# Patient Record
Sex: Male | Born: 1935 | Race: White | Hispanic: No | Smoking: Never smoker
Health system: Southern US, Community
[De-identification: ages and names within clinical notes are randomized; demographics above are authoritative.]

## PROBLEM LIST (undated history)

## (undated) DIAGNOSIS — C61 Malignant neoplasm of prostate: Secondary | ICD-10-CM

## (undated) DIAGNOSIS — I10 Essential (primary) hypertension: Secondary | ICD-10-CM

## (undated) DIAGNOSIS — I472 Ventricular tachycardia: Secondary | ICD-10-CM

## (undated) DIAGNOSIS — R0602 Shortness of breath: Secondary | ICD-10-CM

## (undated) DIAGNOSIS — Z8673 Personal history of transient ischemic attack (TIA), and cerebral infarction without residual deficits: Secondary | ICD-10-CM

## (undated) DIAGNOSIS — R569 Unspecified convulsions: Secondary | ICD-10-CM

## (undated) DIAGNOSIS — R778 Other specified abnormalities of plasma proteins: Secondary | ICD-10-CM

## (undated) DIAGNOSIS — Z8719 Personal history of other diseases of the digestive system: Secondary | ICD-10-CM

## (undated) DIAGNOSIS — I5032 Chronic diastolic (congestive) heart failure: Secondary | ICD-10-CM

## (undated) DIAGNOSIS — R7989 Other specified abnormal findings of blood chemistry: Secondary | ICD-10-CM

## (undated) DIAGNOSIS — I4891 Unspecified atrial fibrillation: Secondary | ICD-10-CM

## (undated) DIAGNOSIS — N183 Chronic kidney disease, stage 3 unspecified: Secondary | ICD-10-CM

## (undated) DIAGNOSIS — C7951 Secondary malignant neoplasm of bone: Secondary | ICD-10-CM

## (undated) DIAGNOSIS — D696 Thrombocytopenia, unspecified: Secondary | ICD-10-CM

## (undated) DIAGNOSIS — H409 Unspecified glaucoma: Secondary | ICD-10-CM

## (undated) DIAGNOSIS — E785 Hyperlipidemia, unspecified: Secondary | ICD-10-CM

## (undated) HISTORY — DX: Malignant neoplasm of prostate: C61

## (undated) HISTORY — DX: Unspecified atrial fibrillation: I48.91

## (undated) HISTORY — DX: Ventricular tachycardia: I47.2

## (undated) HISTORY — DX: Hyperlipidemia, unspecified: E78.5

## (undated) HISTORY — DX: Chronic diastolic (congestive) heart failure: I50.32

## (undated) HISTORY — PX: WRIST SURGERY: SHX841

## (undated) HISTORY — DX: Other specified abnormal findings of blood chemistry: R79.89

## (undated) HISTORY — PX: CATARACT EXTRACTION: SUR2

## (undated) HISTORY — DX: Other specified abnormalities of plasma proteins: R77.8

## (undated) HISTORY — DX: Essential (primary) hypertension: I10

---

## 1991-11-20 DIAGNOSIS — C61 Malignant neoplasm of prostate: Secondary | ICD-10-CM

## 1991-11-20 HISTORY — DX: Malignant neoplasm of prostate: C61

## 2001-07-18 ENCOUNTER — Encounter: Payer: Self-pay | Admitting: Orthopedic Surgery

## 2001-07-18 ENCOUNTER — Encounter: Admission: RE | Admit: 2001-07-18 | Discharge: 2001-07-18 | Payer: Self-pay | Admitting: Orthopedic Surgery

## 2001-07-22 ENCOUNTER — Ambulatory Visit (HOSPITAL_BASED_OUTPATIENT_CLINIC_OR_DEPARTMENT_OTHER): Admission: RE | Admit: 2001-07-22 | Discharge: 2001-07-22 | Payer: Self-pay | Admitting: Orthopedic Surgery

## 2002-11-19 DIAGNOSIS — I4729 Other ventricular tachycardia: Secondary | ICD-10-CM

## 2002-11-19 DIAGNOSIS — I472 Ventricular tachycardia: Secondary | ICD-10-CM

## 2002-11-19 HISTORY — DX: Other ventricular tachycardia: I47.29

## 2002-11-19 HISTORY — DX: Ventricular tachycardia: I47.2

## 2003-01-18 ENCOUNTER — Inpatient Hospital Stay (HOSPITAL_COMMUNITY): Admission: EM | Admit: 2003-01-18 | Discharge: 2003-01-19 | Payer: Self-pay | Admitting: *Deleted

## 2003-01-22 ENCOUNTER — Encounter: Payer: Self-pay | Admitting: Cardiology

## 2003-01-22 ENCOUNTER — Ambulatory Visit: Admission: RE | Admit: 2003-01-22 | Discharge: 2003-01-22 | Payer: Self-pay | Admitting: Internal Medicine

## 2003-02-02 ENCOUNTER — Encounter: Admission: RE | Admit: 2003-02-02 | Discharge: 2003-02-02 | Payer: Self-pay | Admitting: Family Medicine

## 2003-07-07 ENCOUNTER — Encounter (INDEPENDENT_AMBULATORY_CARE_PROVIDER_SITE_OTHER): Payer: Self-pay | Admitting: *Deleted

## 2003-07-07 ENCOUNTER — Ambulatory Visit (HOSPITAL_BASED_OUTPATIENT_CLINIC_OR_DEPARTMENT_OTHER): Admission: RE | Admit: 2003-07-07 | Discharge: 2003-07-07 | Payer: Self-pay | Admitting: Plastic Surgery

## 2007-01-02 ENCOUNTER — Ambulatory Visit: Payer: Self-pay | Admitting: Internal Medicine

## 2007-01-02 ENCOUNTER — Inpatient Hospital Stay (HOSPITAL_COMMUNITY): Admission: EM | Admit: 2007-01-02 | Discharge: 2007-01-03 | Payer: Self-pay | Admitting: Emergency Medicine

## 2007-01-03 ENCOUNTER — Encounter: Payer: Self-pay | Admitting: Internal Medicine

## 2007-03-03 ENCOUNTER — Ambulatory Visit: Payer: Self-pay | Admitting: Internal Medicine

## 2007-03-18 ENCOUNTER — Ambulatory Visit: Payer: Self-pay

## 2008-06-01 ENCOUNTER — Ambulatory Visit (HOSPITAL_COMMUNITY): Admission: RE | Admit: 2008-06-01 | Discharge: 2008-06-01 | Payer: Self-pay | Admitting: Urology

## 2009-12-12 ENCOUNTER — Ambulatory Visit: Payer: Self-pay | Admitting: Cardiology

## 2009-12-12 DIAGNOSIS — I472 Ventricular tachycardia, unspecified: Secondary | ICD-10-CM | POA: Insufficient documentation

## 2009-12-12 DIAGNOSIS — E785 Hyperlipidemia, unspecified: Secondary | ICD-10-CM

## 2009-12-12 DIAGNOSIS — I4891 Unspecified atrial fibrillation: Secondary | ICD-10-CM

## 2009-12-12 DIAGNOSIS — I10 Essential (primary) hypertension: Secondary | ICD-10-CM | POA: Insufficient documentation

## 2009-12-13 ENCOUNTER — Telehealth: Payer: Self-pay | Admitting: Cardiology

## 2009-12-15 ENCOUNTER — Telehealth: Payer: Self-pay | Admitting: Cardiology

## 2009-12-16 ENCOUNTER — Encounter (INDEPENDENT_AMBULATORY_CARE_PROVIDER_SITE_OTHER): Payer: Self-pay | Admitting: Cardiology

## 2009-12-16 ENCOUNTER — Ambulatory Visit: Payer: Self-pay | Admitting: Internal Medicine

## 2009-12-16 LAB — CONVERTED CEMR LAB: POC INR: 2.2

## 2009-12-20 ENCOUNTER — Ambulatory Visit: Payer: Self-pay | Admitting: Cardiology

## 2009-12-23 ENCOUNTER — Inpatient Hospital Stay (HOSPITAL_COMMUNITY): Admission: EM | Admit: 2009-12-23 | Discharge: 2009-12-24 | Payer: Self-pay | Admitting: Emergency Medicine

## 2009-12-25 ENCOUNTER — Ambulatory Visit: Payer: Self-pay | Admitting: Internal Medicine

## 2009-12-25 ENCOUNTER — Inpatient Hospital Stay (HOSPITAL_COMMUNITY): Admission: EM | Admit: 2009-12-25 | Discharge: 2009-12-28 | Payer: Self-pay | Admitting: Emergency Medicine

## 2009-12-29 ENCOUNTER — Telehealth (INDEPENDENT_AMBULATORY_CARE_PROVIDER_SITE_OTHER): Payer: Self-pay | Admitting: Pharmacist

## 2009-12-30 ENCOUNTER — Telehealth: Payer: Self-pay | Admitting: Cardiology

## 2009-12-30 ENCOUNTER — Encounter (INDEPENDENT_AMBULATORY_CARE_PROVIDER_SITE_OTHER): Payer: Self-pay | Admitting: Cardiology

## 2009-12-30 ENCOUNTER — Encounter: Payer: Self-pay | Admitting: Internal Medicine

## 2010-01-03 ENCOUNTER — Encounter: Payer: Self-pay | Admitting: Internal Medicine

## 2010-01-03 LAB — CONVERTED CEMR LAB
POC INR: 2.8
Prothrombin Time: 33.3 s

## 2010-01-04 ENCOUNTER — Ambulatory Visit: Payer: Self-pay | Admitting: Cardiology

## 2010-01-04 DIAGNOSIS — I5032 Chronic diastolic (congestive) heart failure: Secondary | ICD-10-CM

## 2010-01-05 LAB — CONVERTED CEMR LAB
AST: 27 units/L (ref 0–37)
Alkaline Phosphatase: 54 units/L (ref 39–117)
CO2: 31 meq/L (ref 19–32)
Chloride: 98 meq/L (ref 96–112)
HDL: 69.8 mg/dL (ref >39.00)
LDL Cholesterol: 77 mg/dL (ref 0–99)
Potassium: 4.3 meq/L (ref 3.5–5.1)
Pro B Natriuretic peptide (BNP): 170 pg/mL — ABNORMAL HIGH (ref 0.0–100.0)
Sodium: 136 meq/L (ref 135–145)
TSH: 2 microintl units/mL (ref 0.35–5.50)
Total Bilirubin: 0.7 mg/dL (ref 0.3–1.2)
VLDL: 12.8 mg/dL (ref 0.0–40.0)

## 2010-01-10 ENCOUNTER — Encounter: Payer: Self-pay | Admitting: Cardiology

## 2010-01-10 LAB — CONVERTED CEMR LAB: POC INR: 4.2

## 2010-01-17 ENCOUNTER — Telehealth (INDEPENDENT_AMBULATORY_CARE_PROVIDER_SITE_OTHER): Payer: Self-pay | Admitting: *Deleted

## 2010-01-17 ENCOUNTER — Encounter: Payer: Self-pay | Admitting: Cardiovascular Disease

## 2010-01-17 ENCOUNTER — Encounter (INDEPENDENT_AMBULATORY_CARE_PROVIDER_SITE_OTHER): Payer: Self-pay | Admitting: Cardiology

## 2010-01-17 LAB — CONVERTED CEMR LAB: Prothrombin Time: 34.2 s

## 2010-01-18 ENCOUNTER — Encounter (HOSPITAL_COMMUNITY): Admission: RE | Admit: 2010-01-18 | Discharge: 2010-03-21 | Payer: Self-pay | Admitting: Cardiology

## 2010-01-18 ENCOUNTER — Ambulatory Visit: Payer: Self-pay | Admitting: Cardiology

## 2010-01-18 ENCOUNTER — Ambulatory Visit: Payer: Self-pay

## 2010-01-18 ENCOUNTER — Ambulatory Visit: Payer: Self-pay | Admitting: Cardiovascular Disease

## 2010-01-18 LAB — CONVERTED CEMR LAB
CO2: 34 meq/L — ABNORMAL HIGH (ref 19–32)
Calcium: 8.9 mg/dL (ref 8.4–10.5)
Creatinine, Ser: 1 mg/dL (ref 0.4–1.5)
GFR calc non Af Amer: 75.02 mL/min (ref >60)
Glucose, Bld: 109 mg/dL — ABNORMAL HIGH (ref 70–99)
Sodium: 139 meq/L (ref 135–145)

## 2010-01-20 ENCOUNTER — Ambulatory Visit: Payer: Self-pay | Admitting: Cardiology

## 2010-01-20 LAB — CONVERTED CEMR LAB
BUN: 16 mg/dL (ref 6–23)
Chloride: 104 meq/L (ref 96–112)
Creatinine, Ser: 1.2 mg/dL (ref 0.4–1.5)
GFR calc non Af Amer: 60.79 mL/min (ref >60)
Glucose, Bld: 113 mg/dL — ABNORMAL HIGH (ref 70–99)
Potassium: 6.7 meq/L (ref 3.5–5.1)

## 2010-01-23 ENCOUNTER — Telehealth (INDEPENDENT_AMBULATORY_CARE_PROVIDER_SITE_OTHER): Payer: Self-pay | Admitting: *Deleted

## 2010-01-24 ENCOUNTER — Encounter: Payer: Self-pay | Admitting: Cardiology

## 2010-01-24 LAB — CONVERTED CEMR LAB
POC INR: 3.8
Prothrombin Time: 46 s

## 2010-01-25 LAB — CONVERTED CEMR LAB
BUN: 19 mg/dL (ref 6–23)
Calcium: 8.9 mg/dL (ref 8.4–10.5)
Chloride: 97 meq/L (ref 96–112)
Creatinine, Ser: 1.1 mg/dL (ref 0.4–1.5)
GFR calc non Af Amer: 67.21 mL/min (ref >60)

## 2010-01-26 ENCOUNTER — Telehealth: Payer: Self-pay | Admitting: Cardiology

## 2010-01-31 ENCOUNTER — Encounter: Payer: Self-pay | Admitting: Cardiology

## 2010-02-02 ENCOUNTER — Encounter: Payer: Self-pay | Admitting: Cardiology

## 2010-02-08 ENCOUNTER — Encounter: Payer: Self-pay | Admitting: Internal Medicine

## 2010-02-13 ENCOUNTER — Ambulatory Visit: Payer: Self-pay | Admitting: Cardiology

## 2010-02-14 ENCOUNTER — Encounter: Payer: Self-pay | Admitting: Internal Medicine

## 2010-02-14 ENCOUNTER — Telehealth: Payer: Self-pay | Admitting: Cardiology

## 2010-02-14 LAB — CONVERTED CEMR LAB: POC INR: 2.4

## 2010-02-15 ENCOUNTER — Telehealth: Payer: Self-pay | Admitting: Cardiology

## 2010-02-15 ENCOUNTER — Ambulatory Visit: Payer: Self-pay | Admitting: Internal Medicine

## 2010-02-16 ENCOUNTER — Encounter: Payer: Self-pay | Admitting: Cardiology

## 2010-02-20 ENCOUNTER — Encounter: Payer: Self-pay | Admitting: Internal Medicine

## 2010-02-20 LAB — CONVERTED CEMR LAB
POC INR: 2.4
Prothrombin Time: 28.4 s

## 2010-02-27 ENCOUNTER — Telehealth: Payer: Self-pay | Admitting: Cardiology

## 2010-02-28 ENCOUNTER — Encounter: Payer: Self-pay | Admitting: Cardiology

## 2010-03-06 ENCOUNTER — Encounter: Payer: Self-pay | Admitting: Cardiology

## 2010-03-06 ENCOUNTER — Telehealth: Payer: Self-pay | Admitting: Cardiology

## 2010-03-14 ENCOUNTER — Ambulatory Visit: Payer: Self-pay | Admitting: Cardiovascular Disease

## 2010-03-14 LAB — CONVERTED CEMR LAB: POC INR: 3.2

## 2010-03-31 ENCOUNTER — Ambulatory Visit: Payer: Self-pay | Admitting: Cardiology

## 2010-03-31 ENCOUNTER — Telehealth: Payer: Self-pay | Admitting: Cardiology

## 2010-03-31 LAB — CONVERTED CEMR LAB: POC INR: 2.5

## 2010-04-04 ENCOUNTER — Telehealth: Payer: Self-pay | Admitting: Cardiology

## 2010-04-05 ENCOUNTER — Encounter: Payer: Self-pay | Admitting: Cardiology

## 2010-04-06 ENCOUNTER — Ambulatory Visit: Payer: Self-pay | Admitting: Internal Medicine

## 2010-04-06 LAB — CONVERTED CEMR LAB: POC INR: 1.7

## 2010-04-19 ENCOUNTER — Ambulatory Visit: Payer: Self-pay | Admitting: Cardiology

## 2010-04-19 LAB — CONVERTED CEMR LAB
Basophils Absolute: 0 10*3/uL (ref 0.0–0.1)
Eosinophils Absolute: 0 10*3/uL (ref 0.0–0.7)
Hemoglobin: 13.7 g/dL (ref 13.0–17.0)
Lymphocytes Relative: 14.5 % (ref 12.0–46.0)
MCHC: 34.9 g/dL (ref 30.0–36.0)
Monocytes Relative: 14.8 % — ABNORMAL HIGH (ref 3.0–12.0)
Neutro Abs: 3.5 10*3/uL (ref 1.4–7.7)
Neutrophils Relative %: 69.9 % (ref 43.0–77.0)
Platelets: 165 10*3/uL (ref 150.0–400.0)
RDW: 15.6 % — ABNORMAL HIGH (ref 11.5–14.6)

## 2010-04-20 ENCOUNTER — Telehealth: Payer: Self-pay | Admitting: Cardiology

## 2010-06-09 ENCOUNTER — Encounter: Payer: Self-pay | Admitting: Cardiology

## 2010-06-13 ENCOUNTER — Ambulatory Visit: Payer: Self-pay | Admitting: Cardiology

## 2010-06-13 LAB — CONVERTED CEMR LAB
ALT: 22 U/L (ref 0–53)
AST: 30 U/L (ref 0–37)
Albumin: 3.4 g/dL — ABNORMAL LOW (ref 3.5–5.2)
Alkaline Phosphatase: 68 U/L (ref 39–117)
BUN: 20 mg/dL (ref 6–23)
Bilirubin, Direct: 0.1 mg/dL (ref 0.0–0.3)
CO2: 32 meq/L (ref 19–32)
Calcium: 9.2 mg/dL (ref 8.4–10.5)
Chloride: 104 meq/L (ref 96–112)
Creatinine, Ser: 1.4 mg/dL (ref 0.4–1.5)
GFR calc non Af Amer: 51.26 mL/min (ref >60)
Glucose, Bld: 136 mg/dL — ABNORMAL HIGH (ref 70–99)
Potassium: 6 meq/L — ABNORMAL HIGH (ref 3.5–5.1)
Sodium: 142 meq/L (ref 135–145)
TSH: 1.15 u[IU]/mL (ref 0.35–5.50)
Total Bilirubin: 0.4 mg/dL (ref 0.3–1.2)
Total Protein: 6 g/dL (ref 6.0–8.3)

## 2010-06-16 LAB — CONVERTED CEMR LAB
BUN: 25 mg/dL — ABNORMAL HIGH (ref 6–23)
CO2: 27 meq/L (ref 19–32)
Calcium: 9.6 mg/dL (ref 8.4–10.5)
Glucose, Bld: 99 mg/dL (ref 70–99)
Potassium: 5.1 meq/L (ref 3.5–5.3)
Sodium: 138 meq/L (ref 135–145)

## 2010-06-21 ENCOUNTER — Ambulatory Visit: Payer: Self-pay | Admitting: Cardiology

## 2010-06-26 LAB — CONVERTED CEMR LAB
CO2: 28 meq/L (ref 19–32)
Calcium: 8.6 mg/dL (ref 8.4–10.5)
Creatinine, Ser: 1.1 mg/dL (ref 0.4–1.5)
GFR calc non Af Amer: 64.43 mL/min (ref >60)
Glucose, Bld: 99 mg/dL (ref 70–99)

## 2010-08-01 ENCOUNTER — Telehealth: Payer: Self-pay | Admitting: Cardiology

## 2010-08-03 ENCOUNTER — Ambulatory Visit: Payer: Self-pay | Admitting: Cardiology

## 2010-11-09 ENCOUNTER — Encounter: Payer: Self-pay | Admitting: Cardiology

## 2010-12-19 NOTE — Consult Note (Signed)
Summary: Urgent Medical Referral Form  Urgent Medical Referral Form   Imported By: Roderic Ovens 12/15/2009 16:35:55  _____________________________________________________________________  External Attachment:    Type:   Image     Comment:   External Document

## 2010-12-19 NOTE — Progress Notes (Signed)
Summary: call son re in home nursing care  Phone Note Call from Patient Call back at [phone]   Caller: Son Reason for Call: Talk to Nurse Summary of Call: Son would like to speak to RN in follow up...in home nursing care. Initial call taken by: Burnard Leigh,  February 15, 2010 8:42 AM  Follow-up for Phone Call        talked with son,Henry Meyers--extension of home care services has been denied--discussed options with son..he will check through social services ,[facility], or ask [facility] for options

## 2010-12-19 NOTE — Assessment & Plan Note (Signed)
Summary: Cardiology Nuclear Study  Nuclear Med Background Indications for Stress Test: Evaluation for Ischemia  Indications Comments: 12/20/09 [facility] DOE FATIGUE/AFIB 12/25/09 MC SYNCOPE/AFIB MILD INCREASE IN CARDIAC ENZYMES  History: Cardioversion, Echo, GXT  History Comments: '85 GXT (+) ST changes with no further testing 12/22/09 ECHO 50-55% AFIB 12/22/09 AND 02/07/11CARDIOVERSION-AFIB  Symptoms: DOE, Fatigue, Syncope    Nuclear Pre-Procedure Cardiac Risk Factors: Hypertension, Lipids Caffeine/Decaff Intake: None NPO After: 7:00 PM Lungs: clear IV 0.9% NS with Angio Cath: 22g     IV Site: (R) AC IV Started by: Irean Hong RN Chest Size (in) 40     Height (in): 67 Weight (lb): 148 BMI: 23.26  Nuclear Med Study 1 or 2 day study:  1 day     Stress Test Type:  Eugenie Birks Reading MD:  Charlton Haws, MD     Referring MD:  [facility] Resting Radionuclide:  Technetium 55m Tetrofosmin     Resting Radionuclide Dose:  11.0 mCi  Stress Radionuclide:  Technetium 95m Tetrofosmin     Stress Radionuclide Dose:  33.0 mCi   Stress Protocol   Lexiscan: 0.4 mg   Stress Test Technologist:  Milana Na EMT-P     Nuclear Technologist:  Burna Mortimer Deal RT-N  Rest Procedure  Myocardial perfusion imaging was performed at rest 45 minutes following the intravenous administration of Myoview Technetium 64m Tetrofosmin.  Stress Procedure  The patient received IV Lexiscan 0.4 mg over 15-seconds.  Myoview injected at 30-seconds.  There were no significant changes with infusion.  Quantitative spect images were obtained after a 45 minute delay.  QPS Raw Data Images:  Normal; no motion artifact; normal heart/lung ratio. Stress Images:  NI: Uniform and normal uptake of tracer in all myocardial segments. Rest Images:  Normal homogeneous uptake in all areas of the myocardium. Subtraction (SDS):  Normal Transient Ischemic Dilatation:  .92  (Normal <1.22)  Lung/Heart Ratio:  .23  (Normal  <0.45)  Quantitative Gated Spect Images QGS EDV:  49 ml QGS ESV:  12 ml QGS EF:  76 % QGS cine images:  normal  Findings Normal nuclear study      Overall Impression  Exercise Capacity: Lexiscan BP Response: Normal blood pressure response. Clinical Symptoms: Dyspnea ECG Impression: No significant ST segment change suggestive of ischemia. Overall Impression: Normal stress nuclear study. Overall Impression Comments: Normal  Appended Document: Cardiology Nuclear Study normal study.   Appended Document: Cardiology Nuclear Study LMVM   Appended Document: Cardiology Nuclear Study discussed with pt by telephone

## 2010-12-19 NOTE — Miscellaneous (Signed)
Summary: [facility] Orders   [facility] Orders   Imported By: Roderic Ovens 04/26/2010 16:25:07  _____________________________________________________________________  External Attachment:    Type:   Image     Comment:   External Document

## 2010-12-19 NOTE — Assessment & Plan Note (Signed)
Summary: np6/afib/progressive arrythmia   Visit Type:  Initial Consult Primary Provider:  Dr. Perrin Maltese  CC:  Atrial Fibrillation.  History of Present Illness: The patient is a pleasant gentleman without significant prior cardiac history. He was seeing his primary physicians today when he was noted to have atrial fibrillation with a rapid rate. The patient does not feel this. He feels no palpitations and has had no presyncope or syncope. He denies any shortness of breath. He does not have PND or orthopnea. He has not have neck pressure, chest discomfort or arm discomfort. He has been dealing with stress since the death of his wife of 66 years. He thinks he is slowly recovering from this. He does walk often 2 miles per day. He rides a stationary bicycle.  Of note he did see Dr. Graciela Husbands some years ago apparently for treatment of nonsustained ventricular tachycardia. However, I at present don't have these notes.   Current Medications (verified): 1)  Amlodipine Besy-Benazepril Hcl 5-10 Mg Caps (Amlodipine Besy-Benazepril Hcl) .Marland Kitchen.. 1 By Mouth Daily 2)  Hydrochlorothiazide 12.5 Mg Caps (Hydrochlorothiazide) .Marland Kitchen.. 1 By Mouth Daily 3)  Lipitor 10 Mg Tabs (Atorvastatin Calcium) .Marland Kitchen.. 1 By Mouth Daily 4)  Multivitamins   Tabs (Multiple Vitamin) .Marland Kitchen.. 1 By Mouth Daily 5)  Aspirin 325 Mg  Tabs (Aspirin) .Marland Kitchen.. 1 By Mouth Daily 6)  Metoprolol Tartrate 25 Mg Tabs (Metoprolol Tartrate) .... One By Mouth Three Times A Day 7)  Warfarin Sodium 5 Mg Tabs (Warfarin Sodium) .... One By Mouth Daily or As Directed  Allergies (verified): No Known Drug Allergies  Past History:  Past Medical History:  1. Hypertension  x 5 years  2. Hyperlipidemia. x 2 years  3. History of nonsustained ventricular tachycardia in 2004.  4. History of prostate cancer, status post radioactive seeds and now      on Lupron therapy.  Past Surgical History: Wrist surgery  Family History: Mother died at age 42.  Father died at age 32 due  to  Parkinson disease.  There is no family history of premature coronary artery disease.  Social History: He lives in [place].  He he is a widower and retired. Denies any tobacco use.  He drinks alcohol socially.  Review of Systems       As stated in the HPI and negative for all other systems.   Vital Signs:  Patient profile:   77 year old male Height:      67 inches Weight:      159 pounds Pulse rate:   70 / minute Resp:     18 per minute BP sitting:   143 / 111  (right arm)  Vitals Entered By: Marrion Coy, CNA (December 12, 2009 10:54 AM)  Physical Exam  General:  Well developed, well nourished, in no acute distress. Head:  normocephalic and atraumatic Eyes:  PERRLA/EOM intact; conjunctiva and lids normal. Mouth:  Teeth, gums and palate normal. Oral mucosa normal. Neck:  Neck supple, no JVD. No masses, thyromegaly or abnormal cervical nodes. Chest Wall:  no deformities or breast masses noted Lungs:  Clear bilaterally to auscultation and percussion. Abdomen:  Bowel sounds positive; abdomen soft and non-tender without masses, organomegaly, or hernias noted. No hepatosplenomegaly. Msk:  Back normal, normal gait. Muscle strength and tone normal. Extremities:  Mild bilateral ankle edema Neurologic:  Alert and oriented x 3. Skin:  Intact without lesions or rashes. Cervical Nodes:  no significant adenopathy Axillary Nodes:  no significant adenopathy Inguinal Nodes:  no significant  adenopathy Psych:  Normal affect.   Detailed Cardiovascular Exam  Neck    Carotids: Carotids full and equal bilaterally without bruits.      Neck Veins: Normal, no JVD.    Heart    Inspection: no deformities or lifts noted.      Palpation: normal PMI with no thrills palpable.      Auscultation: irregular rate and rhythm, S1, S2 without murmurs, rubs, gallops, or clicks.    Vascular    Abdominal Aorta: no palpable masses, pulsations, or audible bruits.      Femoral Pulses: normal femoral  pulses bilaterally.      Pedal Pulses: diminished right dorsalis pedis pulse, diminished right posterior tibial pulse, absent left dorsalis pedis pulse, and absent left posterior tibial pulse.      Radial Pulses: normal radial pulses bilaterally.      Peripheral Circulation: no clubbing, cyanosis, or edema noted with normal capillary refill.     EKG  Procedure date:  12/12/2009  Findings:      atrial fibrillation, rate 141, axis within normal limits, intervals within normal limits, nonspecific T wave changes.  Impression & Recommendations:  Problem # 1:  ATRIAL FIBRILLATION (ICD-427.31) The patient seems to be very asymptomatic despite this A. fib with a rapid rate. He has no contraindication to anticoagulation. He knows the drug well as his wife was on it. He is to be enrolled in our Coumadin clinic and will start that medication today with a Coumadin check on Friday. He will also start metoprolol 25 mg t.i.d. If his rate is slower when we see him on Friday he will be scheduled to come back for an echocardiogram. I will plan to cardiovert him in the weeks to come after he has it on therapeutic Coumadin. Orders: [facility] Referral (Coumadin clinic)  Problem # 2:  HYPERTENSION (ICD-401.9) His blood pressure is slightly elevated but will be treated in the context of managing the A. fib  Problem # 3:  HYPERLIPIDEMIA (ICD-272.4) He he is on Lipitor and we'll continue with this med per his primary physician. I do not have an old lipid profile for review.  Patient Instructions: 1)  Your physician recommends that you schedule a follow-up appointment in: 2 weeks with Dr Antoine Poche 2)  Your physician has recommended you make the following change in your medication: Start metoprolol tartrate 25 mg one three times a day, start Coumadin 5 mg daily or as directed 3)  You have been referred to Coumadin Clinic as a new pt (needs appt Friday 427.31) 4)  You have been diagnosed with  atrial fibrillation.  Atrial fibrillation is a condition in which one of the upper chambers of the heart has extra electrical cells causing it to beat very fast.  Please see the handout/brochure given to you today for further information. 5)  Your physician has requested that you have an echocardiogram.  Echocardiography is a painless test that uses sound waves to create images of your heart. It provides your doctor with information about the size and shape of your heart and how well your heart's chambers and valves are working.  This procedure takes approximately one hour. There are no restrictions for this procedure.  Will be scheduled after heart rate slows down Prescriptions: WARFARIN SODIUM 5 MG TABS (WARFARIN SODIUM) one by mouth daily or as directed  #30 x 3   Entered by:   Charolotte Capuchin, RN   Authorized by:   Rollene Rotunda, MD, [facility]  Signed by:   Charolotte Capuchin, RN on 12/12/2009   Method used:   Electronically to        [facility] (retail)       [address]       [place], [place]  [address]       Ph: [phone] or [phone]       Fax: [phone]   RxID:   [id number] METOPROLOL TARTRATE 25 MG TABS (METOPROLOL TARTRATE) one by mouth three times a day  #90 x 3   Entered by:   Charolotte Capuchin, RN   Authorized by:   Rollene Rotunda, MD, [facility]   Signed by:   Charolotte Capuchin, RN on 12/12/2009   Method used:   Electronically to        [facility] (retail)       [address]       [place], [place]  [address]       Ph: [phone] or [phone]       Fax: [phone]   RxID:   [id number]

## 2010-12-19 NOTE — Letter (Signed)
Summary: Handout Printed  Printed Handout:  - Coumadin Instructions-w/out Meds 

## 2010-12-19 NOTE — Medication Information (Signed)
Summary: Coumadin Clinic  Anticoagulant Therapy  Managed by: Cloyde Reams, RN, BSN Referring MD: [facility] PCP: Dr. Leandrew Koyanagi MD: Johney Frame MD, Fayrene Fearing Indication 1: Atrial Fibrillation Lab Used: [facility] [place] Site: [address] PT 32.4 INR POC 2.7 INR RANGE 2 - 3    Bleeding/hemorrhagic complications: no     Any changes in medication regimen? no     Any missed doses?: no        Comments: Pt has 1 more week of HH after next week, then will need to come into clinic to have PT/INR done and pill boxes filled at [facility].    Allergies: No Known Drug Allergies  Anticoagulation Management History:      His anticoagulation is being managed by telephone today.  Positive risk factors for bleeding include an age of 75 years or older.  The bleeding index is 'intermediate risk'.  Positive CHADS2 values include History of CHF, History of HTN, and Age > 75 years old.  Prothrombin time is 32.4.  Anticoagulation responsible provider: Nessie Nong MD, Fayrene Fearing.  INR POC: 2.7.  Exp: 02/2011.    Anticoagulation Management Assessment/Plan:      The patient's current anticoagulation dose is Warfarin sodium 2.5 mg tabs: Use as directed by anticoagulation clinic..  The target INR is 2.0-3.0.  The next INR is due 02/14/2010.  Anticoagulation instructions were given to Dennie Bible, [facility] RN.  Results were reviewed/authorized by Cloyde Reams, RN, BSN.  He was notified by Cloyde Reams RN.         Prior Anticoagulation Instructions: INR 2.1  Continue taking 2.5 mg on Sunday, Tuesday, and Thursday and take 1.25 mg all other days.  Recheck INR in 1 week.  Orders given to Dennie Bible, [facility] RN.  Current Anticoagulation Instructions: INR 2.7  Spoke with Dennie Bible, [facility], RN while at pt's home.  Advised to continue on same dosage 1.25mg  daily except 2.5mg  on Su,Tu,Th.  Recheck in 1 week on 02/14/10.

## 2010-12-19 NOTE — Medication Information (Signed)
Summary: Coumadin Clinic  Anticoagulant Therapy  Managed by: Cloyde Reams, RN, BSN Referring MD: Rollene Rotunda, MD PCP: Dr. Leandrew Koyanagi MD: Graciela Husbands MD, Viviann Spare Indication 1: Atrial Fibrillation Lab Used: [facility] [place] Site: [address] PT 33.3 INR POC 2.8 INR RANGE 2 - 3    Bleeding/hemorrhagic complications: no     Any changes in medication regimen? no     Any missed doses?: no         Allergies: No Known Drug Allergies  Anticoagulation Management History:      His anticoagulation is being managed by telephone today.  Positive risk factors for bleeding include an age of 75 years or older.  The bleeding index is 'intermediate risk'.  Positive CHADS2 values include History of HTN and Age > 14 years old.  Prothrombin time is 33.3.  Anticoagulation responsible provider: Graciela Husbands MD, Viviann Spare.  INR POC: 2.8.  Exp: 02/2011.    Anticoagulation Management Assessment/Plan:      The patient's current anticoagulation dose is Warfarin sodium 2.5 mg tabs: Use as directed by anticoagulation clinic..  The target INR is 2.0-3.0.  The next INR is due 01/10/2010.  Anticoagulation instructions were given to Dennie Bible, [facility] RN.  Results were reviewed/authorized by Cloyde Reams, RN, BSN.  He was notified by Cloyde Reams, RN, BSN.         Prior Anticoagulation Instructions: INR 3.3  Skip 1 day then 0.5 tab except 0.25 tab on Mondays.    Recheck in 1 week.  Order to Dennie Bible, RN [facility]  to recheck in 5 days.     Current Anticoagulation Instructions: INR 2.8  Spoke with Dennie Bible, [facility] RN while at pt's home.  Advised to have pt to continue on same dosage 2.5mg  daily except 1.25mg  on Mondays.  Recheck in 1 week.

## 2010-12-19 NOTE — Progress Notes (Signed)
Summary: LOST 2lb SINCE HE BEEN HOME FROM HOSPITAL/ lmom  Phone Note Call from Patient Call back at Home Phone [phone]   Caller: Patient Summary of Call: PT SAID HE HAS LOST 2POUNDS SINCE HE HAS BEEN HOME FROM HOSPITAL. Initial call taken by: Judie Grieve,  December 30, 2009 8:31 AM  Follow-up for Phone Call        Called patient and left message on machine to call back  Sander Nephew, RN per pt wt is down 2 lbs   141 at hosp, 139 lbs today.  feels good but a little tired.  He has no complaints just following up with wt. Follow-up by: Charolotte Capuchin, RN,  December 30, 2009 6:11 PM

## 2010-12-19 NOTE — Progress Notes (Signed)
Summary: b/p reading--03-06-10  Phone Note Call from Patient Call back at Home Phone [phone]   Caller: Patient Reason for Call: Talk to Nurse Summary of Call: have b/p reading 4/15 @ 6:30 a.m. 130/59 - before meds 4/15 @ 7:00 a.m took meds 4/15 @ 8:30 a.m. 154/86 4/15 @ 11:30 141/64 4/15 @ 2 p.m. 117/64 4/15 @ 9p.m 122/59.   pt have additional reading for 4/17 . Initial call taken by: Lorne Skeens,  March 06, 2010 9:28 AM  Follow-up for Phone Call        talked with patient --he is having some back pain but otherwise OK--will forward to Dr Shirlee Latch      Appended Document: b/p reading--03-06-10 continue current regimen for now.   Appended Document: b/p reading--03-06-10 LM for pt call me  Appended Document: b/p reading--03-06-10 discussed with patient by telephone

## 2010-12-19 NOTE — Medication Information (Signed)
Summary: Coumadin Clinic  Anticoagulant Therapy  Managed by: Cloyde Reams, RN, BSN Referring MD: [facility] PCP: Dr. Doreatha Martin MD: Gala Romney MD, Reuel Boom Indication 1: Atrial Fibrillation Lab Used: [facility] [place] Site: [address] PT 28.2 INR POC 2.4 INR RANGE 2 - 3    Bleeding/hemorrhagic complications: no     Any changes in medication regimen? no     Any missed doses?: no         Allergies: No Known Drug Allergies  Anticoagulation Management History:      His anticoagulation is being managed by telephone today.  Positive risk factors for bleeding include an age of 63 years or older.  The bleeding index is 'intermediate risk'.  Positive CHADS2 values include History of CHF, History of HTN, and Age > 50 years old.  Prothrombin time is 28.2.  Anticoagulation responsible provider: Bensimhon MD, Reuel Boom.  INR POC: 2.4.  Exp: 02/2011.    Anticoagulation Management Assessment/Plan:      The patient's current anticoagulation dose is Warfarin sodium 2.5 mg tabs: Use as directed by anticoagulation clinic..  The target INR is 2.0-3.0.  The next INR is due 02/20/2010.  Anticoagulation instructions were given to Dennie Bible, [facility] RN.  Results were reviewed/authorized by Cloyde Reams, RN, BSN.  He was notified by Cloyde Reams RN.         Prior Anticoagulation Instructions: INR 2.7  Spoke with Dennie Bible, [facility], RN while at pt's home.  Advised to continue on same dosage 1.25mg  daily except 2.5mg  on Su,Tu,Th.  Recheck in 1 week on 02/14/10.   Current Anticoagulation Instructions: INR 2.4  Spoke with Dennie Bible, [facility], RN while at pt's home.  Advised to have pt continue on same dosage 1.25mg  daily except 2.5mg  on Su,Tu,Th.  02/20/10 is pt's last HH visit, will check PT/INR at that time.

## 2010-12-19 NOTE — Progress Notes (Signed)
Summary: [facility] PT WILL BE DISCHARGED  Phone Note Other Incoming   Caller: [facility]/ Henry Meyers [phone] Summary of Call: NURSE CALLING ABOUT THE PT MEETING THE REQUIREMENT FOR HOME CARE( DO NOT MEET REQUIREMENTS ) Initial call taken by: Judie Grieve,  February 14, 2010 9:38 AM  Follow-up for Phone Call        I talked with son,Henry Meyers-he is going to look into other options through [facility] , [facility] and discuss with [facility]

## 2010-12-19 NOTE — Progress Notes (Signed)
Summary: Pt request call   Phone Note Call from Patient Call back at Home Phone [phone]   Caller: Patient Reason for Call: Talk to Nurse Initial call taken by: Judie Grieve,  August 01, 2010 8:19 AM  Follow-up for Phone Call        I spoke with the pt. He is moving to [place] next week. He is wanting to see Dr. Shirlee Latch one more time prior to leaving. He states his house sold faster than what he thought and everything is moving faster than what he anticipated. I will review with Dr. Shirlee Latch to see when the pt could possibly be worked in his schedule. I will call him back. Follow-up by: Sherri Rad, RN, BSN,  August 01, 2010 8:40 AM  Additional Follow-up for Phone Call Additional follow up Details #1::        I spoke with Dr. Shirlee Latch. He said I could add the pt on to his schedule anywhere. The pt is scheduled for 9/15 @ 8:30am. He is aware and agreeable.  Additional Follow-up by: Sherri Rad, RN, BSN,  August 01, 2010 8:55 AM

## 2010-12-19 NOTE — Letter (Signed)
Summary: Generic Letter  Architectural technologist, Main Office  [address]   [place], [place] [address]   Phone: [phone]  Fax: [phone]        June 13, 2010 MRN: [id number]    Henry Meyers [address] [place], [place]  [address] DOB [birth year]   STAT  BMP 276.7 Call results to Aurelio Jew [phone]          Geoge Lawrance,MD  This letter has been electronically signed by your physician.

## 2010-12-19 NOTE — Medication Information (Signed)
Summary: Coumadin Clinic  Anticoagulant Therapy  Managed by: Inactive Referring MD: [facility] PCP: Dr. Cleta Alberts Supervising MD: Daleen Squibb MD, Maisie Fus Indication 1: Atrial Fibrillation Lab Used: [facility] [place] Site: [address] INR RANGE 2 - 3          Comments: Pt is off coumadin, started on Pradaxa. Cloyde Reams RN  June 09, 2010 11:44 AM   Allergies: No Known Drug Allergies  Anticoagulation Management History:      Positive risk factors for bleeding include an age of 61 years or older.  The bleeding index is 'intermediate risk'.  Positive CHADS2 values include History of CHF, History of HTN, and Age > 89 years old.  Anticoagulation responsible provider: Daleen Squibb MD, Maisie Fus.  Exp: 06/2011.    Anticoagulation Management Assessment/Plan:      The target INR is 2.0-3.0.  The next INR is due 04/21/2010.  Anticoagulation instructions were given to Dennie Bible, [facility] RN.  Results were reviewed/authorized by Inactive.         Prior Anticoagulation Instructions: Stopped Coumadin. May begin Pradaxa since INR <2.  F/u labs scheduled for 04/19/10.

## 2010-12-19 NOTE — Progress Notes (Signed)
Summary: refill meds  Phone Note Refill Request Call back at Home Phone [phone] Message from:  Patient on January 23, 2010 8:09 AM  Refills Requested: Medication #1:  POTASSIUM CHLORIDE CRYS CR 20 MEQ CR-TABS 1 tab by mouth once daily  Medication #2:  DILTIAZEM HCL ER BEADS 180 MG XR24H-CAP Take one capsule by mouth daily  Medication #3:  AMIODARONE HCL 200 MG TABS 1 tab by mouth once daily  Medication #4:  FUROSEMIDE 40 MG TABS Take one tablet by mouth daily. [facility] [address] [phone]   Method Requested: Fax to Local Pharmacy Initial call taken by: Lorne Skeens,  January 23, 2010 8:10 AM Caller: Patient Reason for Call: Talk to Nurse  Follow-up for Phone Call        Rx faxed to pharmacy Follow-up by: Oswald Hillock,  January 23, 2010 9:09 AM    Prescriptions: FUROSEMIDE 40 MG TABS (FUROSEMIDE) Take one tablet by mouth daily.  #90 x 1   Entered by:   Oswald Hillock   Authorized by:   Marca Ancona, MD   Signed by:   Oswald Hillock on 01/23/2010   Method used:   Electronically to        [facility]       [address]       [place], [place]  [address]       Ph: [phone] or [phone]       Fax: [phone]   RxID:   [id number] POTASSIUM CHLORIDE CRYS CR 20 MEQ CR-TABS (POTASSIUM CHLORIDE CRYS CR) 1 tab by mouth once daily  #90 x 1   Entered by:   Oswald Hillock   Authorized by:   Marca Ancona, MD   Signed by:   Oswald Hillock on 01/23/2010   Method used:   Electronically to        [facility] ([facility]ail[facility]       [address]       [place], [place]  [address]       Ph: [phone] or [phone]       Fax: [phone]   RxID:   [id number] DILTIAZEM HCL ER BEADS 180 MG XR24H-CAP (DILTIAZEM HCL ER BEADS) Take one capsule by mouth daily  #30 x 6   Entered by:   Oswald Hillock   Authorized by:   Marca Ancona, MD   Signed by:   Oswald Hillock on 01/23/2010   Method used:   Electronically to      [facility]       [address]       [place], [place]  [address]       Ph: [phone] or [phone]       Fax: [phone]   RxID:   [id number] AMIODARONE HCL 200 MG TABS (AMIODARONE HCL) 1 tab by mouth once daily  #30 x 6   Entered by:   Oswald Hillock   Authorized by:   Marca Ancona, MD   Signed by:   Oswald Hillock on 01/23/2010   Method used:   Electronically to        [facility]       [address]       [place], [place]  [address]       Ph: [phone] or [phone]       Fax: [phone]   RxID:   [id number]

## 2010-12-19 NOTE — Medication Information (Signed)
Summary: Coumadin Clinic  Anticoagulant Therapy  Managed by: Weston Brass, PharmD Referring MD: [facility] PCP: Dr. Doreatha Martin MD: Tenny Craw MD, Gunnar Fusi Indication 1: Atrial Fibrillation Lab Used: [facility] [place] Site: [address] PT 28.4 INR POC 2.4 INR RANGE 2 - 3  Dietary changes: no    Health status changes: no    Bleeding/hemorrhagic complications: no    Recent/future hospitalizations: no    Any changes in medication regimen? no    Recent/future dental: no  Any missed doses?: no       Is patient compliant with meds? yes      Comments: Pt being discharged from [facility].  Will need to have med box filled (clear- morning pills and blue- night pills)  Allergies: No Known Drug Allergies  Anticoagulation Management History:      The patient is taking warfarin and comes in today for a routine follow up visit.  Positive risk factors for bleeding include an age of 75 years or older.  The bleeding index is 'intermediate risk'.  Positive CHADS2 values include History of CHF, History of HTN, and Age > 75 years old.  Prothrombin time is 28.4.  Anticoagulation responsible provider: Tenny Craw MD, Gunnar Fusi.  INR POC: 2.4.  Exp: 02/2011.    Anticoagulation Management Assessment/Plan:      The patient's current anticoagulation dose is Warfarin sodium 2.5 mg tabs: Use as directed by anticoagulation clinic..  The target INR is 2.0-3.0.  The next INR is due 03/14/2010.  Anticoagulation instructions were given to Dennie Bible, [facility] RN.  Results were reviewed/authorized by Weston Brass, PharmD.  He was notified by Weston Brass PharmD.         Prior Anticoagulation Instructions: INR 2.4  Spoke with Dennie Bible, [facility], RN while at pt's home.  Advised to have pt continue on same dosage 1.25mg  daily except 2.5mg  on Su,Tu,Th.  02/20/10 is pt's last HH visit, will check PT/INR at that time.    Current Anticoagulation Instructions: INR 2.4  Spoke with Dennie Bible, [facility] RN while at patient's home.  Continue same dose of  1.25mg  daily except 2.5mg  on Sunday, Tuesday, and Thursday.  Pt will come to clinic for next appt

## 2010-12-19 NOTE — Progress Notes (Signed)
Summary: no info given- speak to pam DECREASE METOPROLOL  Phone Note Call from Patient Call back at Home Phone [phone]   Caller: Patient Reason for Call: Talk to Nurse Details for Reason: no info given, wants pam to call him Initial call taken by: Lorne Skeens,  December 15, 2009 11:52 AM  Follow-up for Phone Call        PT CALLING STATING HE CAN'T TAKE METOPROLOL  IT IS MAKING HIM TOO WEAK AND HE DOESN'T FEEL LIKE HE CAN CONTINUE TO TAKE IT. BP115/75, HR IN THE 60, PT IS GOING TO DECREASE METOPROLOL TO 25 MG TWICE A DAY INSTEAD OF three times a day.  PT STATES UNDERSTANDING.  HE HAS A COUMADIN CLINIC APPT TOMORROW. Follow-up by: Charolotte Capuchin, RN,  December 15, 2009 12:30 PM

## 2010-12-19 NOTE — Progress Notes (Signed)
Summary: pt returning call  Phone Note Call from Patient Call back at [facility] Phone [phone]   Caller: Patient Summary of Call: Pt returning call Initial call taken by: Judie Grieve,  Apr 04, 2010 2:58 PM  Follow-up for Phone Call        [facility] discussed instructions with pt for stopping Warfarin and starting Pradaxa-pt will return for a CBC 04-19-10-I have discussed with pt by telephone

## 2010-12-19 NOTE — Assessment & Plan Note (Signed)
Summary: per check out/sf   Primary Provider:  Dr. Cleta Alberts  CC:  check up.  History of Present Illness: 77 yo with paroxysmal atrial fibrillation and diastolic CHF presents for followup.  He was hospitalized twice in 2/11 with atrial fibrillation and rapid ventricular response.  He has been cardioverted twice now and remains in NSR today.  I have started him on amiodarone.  He was diuresed in the hospital due to acute diastolic CHF.   He has been doing well and is back home by himself now.  Weight has been stable (up 1 lb since last appointment).  He is walking on the treadmill at the gym for 15 minutes at a time without significant dyspnea.  He has been walking his dog. He has some generalized fatigue but this tends to be mild.  No chest pain.  BP today is 173/85.  However, readings at home are for the most part systolic 120s-140.  These are checked both by him and by his home health nurse.  Lexiscan-myoview done because of elevated troponin in the setting of afib/RVR while in the hospital showed no evidence of ischemia or infarction and normal EF.    Labs (2/11): BNP 170, K 4.3, creatinine 1.1, LDL 77, HDL 70, TSH normal, LFTs normal Labs (3/11): K 4.3, creatinine 1.1  Current Medications (verified): 1)  Lipitor 10 Mg Tabs (Atorvastatin Calcium) .Marland Kitchen.. 1 By Mouth Daily 2)  Multivitamins   Tabs (Multiple Vitamin) .Marland Kitchen.. 1 By Mouth Daily 3)  Aspirin 81 Mg Tbec (Aspirin) .... Take One Tablet By Mouth Daily 4)  Warfarin Sodium 2.5 Mg Tabs (Warfarin Sodium) .... Use As Directed By Anticoagulation Clinic. 5)  Lumigan 0.03 % Soln (Bimatoprost) .... As Directed 6)  Nitrostat 0.4 Mg Subl (Nitroglycerin) .Marland Kitchen.. 1 Tablet Under Tongue At Onset of Chest Pain; You May Repeat Every 5 Minutes For Up To 3 Doses. 7)  Amiodarone Hcl 200 Mg Tabs (Amiodarone Hcl) .Marland Kitchen.. 1 Tab By Mouth Once Daily 8)  Diltiazem Hcl Er Beads 180 Mg Xr24h-Cap (Diltiazem Hcl Er Beads) .... Take One Capsule By Mouth Daily 9)  Potassium Chloride  Crys Cr 20 Meq Cr-Tabs (Potassium Chloride Crys Cr) .Marland Kitchen.. 1 Tab By Mouth Once Daily 10)  Furosemide 40 Mg Tabs (Furosemide) .... Take One Tablet By Mouth Daily. 11)  Benazepril Hcl 10 Mg Tabs (Benazepril Hcl) .... One Tab By Mouth Once Daily  Allergies: No Known Drug Allergies  Past History:  Past Medical History: 1. Hypertension  2. Hyperlipidemia 3. History of nonsustained ventricular tachycardia in 2004 after wrist surgery. 4. History of prostate cancer, status post radioactive seeds and now on Lupron therapy. 5. Diastolic CHF: Echo (2/11) with EF 50-55%, mild left atrial enlargement.  6. Atrial fibrillation: Poorly tolerated, rapid response and develops CHF.  7. Elevated troponin in the setting of atrial fibrillation with RVR: Lexiscan myoview in 3/11 showed EF 76%, no ischemia or infarction.    Family History: Reviewed history from 12/12/2009 and no changes required. Mother died at age 54.  Father died at age 50 due to  Parkinson disease.  There is no family history of premature coronary artery disease.  Social History: He lives in [place].  He he is a widower and retired. Denies any tobacco use.  He drinks alcohol socially. 2 sons, closest lives in [place].    Review of Systems       All systems reviewed and negative except as per HPI.   Vital Signs:  Patient profile:   77  year old male Height:      67 inches Weight:      151 pounds Pulse rate:   54 / minute Resp:     18 per minute BP sitting:   173 / 85  (left arm)  Vitals Entered By: Kem Parkinson (February 13, 2010 11:42 AM)  Physical Exam  General:  Well developed, well nourished, in no acute distress. Neck:  Neck supple, no JVD. No masses, thyromegaly or abnormal cervical nodes. Lungs:  Clear bilaterally to auscultation and percussion. Heart:  Non-displaced PMI, chest non-tender; regular rate and rhythm, S1, S2 without murmurs, rubs. +S4. Carotid upstroke normal, no bruit.  Pedals normal pulses. 1+ edema  ankle edema.  Abdomen:  Bowel sounds positive; abdomen soft and non-tender without masses, organomegaly, or hernias noted. No hepatosplenomegaly. Extremities:  No clubbing or cyanosis. Neurologic:  Alert and oriented x 3. Psych:  Normal affect.   Impression & Recommendations:  Problem # 1:  DIASTOLIC HEART FAILURE, CHRONIC (ICD-428.32) Patient looks euvolemic today with JVP normal.  Continue current dose of Lasix with KCl.  Follow weights at home.  He is trying hard to avoid sodium.   Problem # 2:  ATRIAL FIBRILLATION (ICD-427.31) Maintaining NSR on amiodarone today.  He will continue coumadin, diltiazem CD, and amiodarone.  TSH and LFTs were normal in 2/11.  Will get baseline PFTs with amiodarone use. He does not tolerate atrial fibrillation well (RVR with CHF).   Problem # 3:  HYPERLIPIDEMIA (ICD-272.4) Good lipids when last checked, on Lipitor 10 mg daily.   Problem # 4:  HYPERTENSION (ICD-401.9) BP elevated today in the office but has been within normal limits when checked by home health nurse at home.  I will have my nurse call himin 2 weeks (he will record BP readings daily until then) and we will see what BP is running at home.    Problem # 5:  ELEVATED TROPONIN Elevated troponin in the setting of atrial fibrillation with RVR. Lexiscan myoview was negative.  Elevated troponin was likely due to demand ischemia in the setting of tachycardia.    Other Orders: Pulmonary Function Test (PFT)  Patient Instructions: 1)  Take and record your blood pressure--I will call you in 2 weeks to get the readings. Luana Shu  2)  Your physician has recommended that you have a pulmonary function test.  Pulmonary Function Tests are a group of tests that measure how well air moves in and out of your lungs. 3)  Your physician wants you to follow-up in:  4 months with Dr Shirlee Latch.  You will receive a reminder letter in the mail two months in advance. If you don't receive a letter, please call our  office to schedule the follow-up appointment.

## 2010-12-19 NOTE — Miscellaneous (Signed)
Summary: [facility] Orders  [facility] Orders   Imported By: Roderic Ovens 03/20/2010 12:23:05  _____________________________________________________________________  External Attachment:    Type:   Image     Comment:   External Document

## 2010-12-19 NOTE — Miscellaneous (Signed)
Summary: Orders Update pft charges  Clinical Lists Changes  Orders: Added new Service order of Carbon Monoxide diffusing w/capacity (94720) - Signed Added new Service order of Lung Volumes (94240) - Signed Added new Service order of Spirometry (Pre & Post) (94060) - Signed 

## 2010-12-19 NOTE — Progress Notes (Signed)
Summary: gym  Phone Note Call from Patient Call back at Home Phone [phone]   Caller: Patient Reason for Call: Talk to Doctor Summary of Call: pt has been going to the gym; he wants to know if he can use the treadmill? Initial call taken by: Migdalia Dk,  January 26, 2010 8:05 AM  Follow-up for Phone Call        He was calling because he was on the treadmill the other day at the gym and someone that worked at the gym ask him if he should be doing that. He said he started thinking about it and decided he might should check. He just had a stress test this month that was normal  with an EF of 76%. I advised him to listen to his body and do what makes him feel the best. He said he always feels so much better when he walks 10 mins on the treadmill. He was thankful for the information.  Follow-up by: Duncan Dull, RN, BSN,  January 26, 2010 9:09 AM     Appended Document: gym Fine for him to walk on treadmill.  Would encourage it.   Appended Document: gym discussed with pt by telephone--encouraged him to walk on the treadmill

## 2010-12-19 NOTE — Medication Information (Signed)
Summary: Coumadin Clinic  Anticoagulant Therapy  Managed by: Bethena Midget, RN, BSN Referring MD: Rollene Rotunda, MD PCP: Dr. Perrin Maltese Indication 1: Atrial Fibrillation Lab Used: [facility] [place] Site: [address] PT 39.4 INR POC 3.3 INR RANGE 2 - 3  Dietary changes: no    Health status changes: no    Bleeding/hemorrhagic complications: no    Recent/future hospitalizations: no    Any changes in medication regimen? no    Recent/future dental: no  Any missed doses?: no       Is patient compliant with meds? yes       Allergies (verified): No Known Drug Allergies  Anticoagulation Management History:      His anticoagulation is being managed by telephone today.  Positive risk factors for bleeding include an age of 75 years or older.  The bleeding index is 'intermediate risk'.  Positive CHADS2 values include History of HTN and Age > 75 years old.  Prothrombin time is 39.4.  INR POC: 3.3.  Exp: 02/2011.    Anticoagulation Management Assessment/Plan:      The patient's current anticoagulation dose is Warfarin sodium 5 mg tabs: one by mouth daily or as directed.  The next INR is due 12/27/2009.  Anticoagulation instructions were given to patient.  Results were reviewed/authorized by Bethena Midget, RN, BSN.  He was notified by Shelby Dubin PharmD, BCPS, CPP.         Prior Anticoagulation Instructions: INR 2.2  Reduce warfarin to 0.5 tab = 2.5 mg daily.  Recheck in 1 week.    Current Anticoagulation Instructions: INR 3.3  Skip 1 day then 0.5 tab except 0.25 tab on Mondays.    Recheck in 1 week.  Order to Dennie Bible, RN [facility]  to recheck in 5 days.

## 2010-12-19 NOTE — Progress Notes (Signed)
Summary: B/P readings  Phone Note Outgoing Call   Call placed by: Katina Dung, RN, BSN,  February 27, 2010 5:01 PM Call placed to: Patient Summary of Call: B/P readings  Follow-up for Phone Call        attempted to reach pt to get B/P readings--NA Katina Dung, RN, BSN  February 27, 2010 5:05 PM  LMVM for pt to call me  Katina Dung, RN, BSN  February 28, 2010 2:09 PM   returning call, Migdalia Dk  February 28, 2010 4:55 PM  LMVM for pt to call me Katina Dung, RN, BSN  February 28, 2010 5:20 PM   returning call, Migdalia Dk  March 01, 2010 8:24 AM.  Sherron Monday with pt. B/P reading: 123/75, 140/78, 112/75, 125/72, 150/82.This last  B/P reading was take right after using the treadmill. Next day B/P 111/70, 112/57 and  131/76. Follow-up by: Ollen Gross, RN, BSN,  March 01, 2010 8:38 AM     Appended Document: B/P readings BP ok  Appended Document: B/P readings discussed with pt by telephone

## 2010-12-19 NOTE — Medication Information (Signed)
Summary: rov/sp  Anticoagulant Therapy  Managed by: Weston Brass, PharmD Referring MD: [facility] PCP: Dr. Doreatha Martin MD: Eden Emms MD, Theron Arista Indication 1: Atrial Fibrillation Lab Used: [facility] [place] Site: [address] INR POC 3.2 INR RANGE 2 - 3  Dietary changes: no    Health status changes: no    Bleeding/hemorrhagic complications: no    Recent/future hospitalizations: no    Any changes in medication regimen? no    Recent/future dental: no  Any missed doses?: no       Is patient compliant with meds? yes      Comments: Filled pt's pill boxes for next 3 weeks   Allergies: No Known Drug Allergies  Anticoagulation Management History:      The patient is taking warfarin and comes in today for a routine follow up visit.  Positive risk factors for bleeding include an age of 10 years or older.  The bleeding index is 'intermediate risk'.  Positive CHADS2 values include History of CHF, History of HTN, and Age > 75 years old.  Anticoagulation responsible provider: Eden Emms MD, Theron Arista.  INR POC: 3.2.  Cuvette Lot#: [id number].  Exp: 02/2011.    Anticoagulation Management Assessment/Plan:      The patient's current anticoagulation dose is Warfarin sodium 2.5 mg tabs: Use as directed by anticoagulation clinic..  The target INR is 2.0-3.0.  The next INR is due 03/31/2010.  Anticoagulation instructions were given to Dennie Bible, [facility] RN.  Results were reviewed/authorized by Weston Brass, PharmD.  He was notified by Weston Brass PharmD.        Coagulation management information includes: [place] [phone].  Prior Anticoagulation Instructions: INR 2.4  Spoke with Dennie Bible, [facility] RN while at patient's home.  Continue same dose of 1.25mg  daily except 2.5mg  on Sunday, Tuesday, and Thursday.  Pt will come to clinic for next appt  Current Anticoagulation Instructions: INR 3.2  Skip today's dose of Coumadin then resume same dose of 1.25mg  every day except 2.5mg  on  Sunday, Tuesday and Thursday

## 2010-12-19 NOTE — Progress Notes (Signed)
  Phone Note Other Incoming   Caller: Sharyl Nimrod, [facility] RN Summary of Call: Sharyl Nimrod, [facility] RN, called regarding Mr. Dartanyon Frankowski.  Mr. Prime is normally followed here in coumadin clinic; however, [facility] will be caring for him for the next 8-9 weeks, and has agreed to follow Mr. Schwandt while [facility] is seeing him.  I have cancelled Mr. Saladin coumadin appt for tomorrow (12/30/09), and have given [facility] instructions to draw INR tomorrow.  At this time we will follow-up and give further instructions. Initial call taken by: Eda Keys, PharmD

## 2010-12-19 NOTE — Medication Information (Signed)
Summary: Coumadin Clinic  Anticoagulant Therapy  Managed by: Eda Keys, PharmD Referring MD: Rollene Rotunda, MD PCP: Dr. Leandrew Koyanagi MD: Jens Som MD, Arlys John Indication 1: Atrial Fibrillation Lab Used: [facility] [place] Site: [address] INR POC 4.2 INR RANGE 2 - 3  Dietary changes: no    Health status changes: no    Bleeding/hemorrhagic complications: no    Recent/future hospitalizations: no    Any changes in medication regimen? yes       Details: benazepril  Recent/future dental: no  Any missed doses?: no       Is patient compliant with meds? yes       Allergies: No Known Drug Allergies  Anticoagulation Management History:      His anticoagulation is being managed by telephone today.  Positive risk factors for bleeding include an age of 35 years or older.  The bleeding index is 'intermediate risk'.  Positive CHADS2 values include History of CHF, History of HTN, and Age > 15 years old.  Anticoagulation responsible provider: Jens Som MD, Arlys John.  INR POC: 4.2.  Cuvette Lot#: [id number].  Exp: 02/2011.    Anticoagulation Management Assessment/Plan:      The patient's current anticoagulation dose is Warfarin sodium 2.5 mg tabs: Use as directed by anticoagulation clinic..  The target INR is 2.0-3.0.  The next INR is due 01/17/2010.  Anticoagulation instructions were given to Henry Meyers, [facility] RN.  Results were reviewed/authorized by Eda Keys, PharmD.  He was notified by Eda Keys, PharmD.         Prior Anticoagulation Instructions: INR 2.8  Spoke with Henry Meyers, [facility] RN while at pt's home.  Advised to have pt to continue on same dosage 2.5mg  daily except 1.25mg  on Mondays.  Recheck in 1 week.    Current Anticoagulation Instructions: INR 4.2  Skip coumadin today and tomorrow.  Then start NEW dosing schedule of 1.25 mg on Monday and Friday and take 2.5  mg all other days.  Recheck in 1 week on Tuesday 01/17/10. Orders given to Ardis Hughs, [facility] RN

## 2010-12-19 NOTE — Medication Information (Signed)
Summary: Coumadin Clinic  Anticoagulant Therapy  Managed by: Eda Keys, PharmD Referring MD: [facility] PCP: Dr. Leandrew Koyanagi MD: Myrtis Ser MD, Tinnie Gens Indication 1: Atrial Fibrillation Lab Used: [facility] [place] Site: [address] PT 25.3 INR POC 2.1 INR RANGE 2 - 3  Dietary changes: no    Health status changes: no    Bleeding/hemorrhagic complications: no    Recent/future hospitalizations: no    Any changes in medication regimen? yes       Details: amiodarone 200 mg daily (vs twice daily) new dosing schedule started 01/24/10  Recent/future dental: no  Any missed doses?: no       Is patient compliant with meds? yes      Comments: Pt to be discharged from [facility] 02/21/10.  [facility] is working diligently with patient to teach him to fill med box, however he often gets confused and needs some help.  Between now and [facility] d/c the RN will help him develop these skills; however, patient may need our help once he is seen by coumadin clinic.     Allergies: No Known Drug Allergies  Anticoagulation Management History:      His anticoagulation is being managed by telephone today.  Positive risk factors for bleeding include an age of 40 years or older.  The bleeding index is 'intermediate risk'.  Positive CHADS2 values include History of CHF, History of HTN, and Age > 75 years old.  Prothrombin time is 25.3.  Anticoagulation responsible provider: Myrtis Ser MD, Tinnie Gens.  INR POC: 2.1.  Exp: 02/2011.    Anticoagulation Management Assessment/Plan:      The patient's current anticoagulation dose is Warfarin sodium 2.5 mg tabs: Use as directed by anticoagulation clinic..  The target INR is 2.0-3.0.  The next INR is due 02/08/2010.  Anticoagulation instructions were given to Dennie Bible, [facility] RN.  Results were reviewed/authorized by Eda Keys, PharmD.  He was notified by Eda Keys, PharmD.         Prior Anticoagulation Instructions: INR 3.8  Spoke with Dennie Bible, [facility], RN while at pt's  home.  Advised to hold coumadin x 1 dosage, then decr dosage to 1.25mg  daily except 2.5mg  on Su,Tu,Th.  Recheck in 1 week.     Current Anticoagulation Instructions: INR 2.1  Continue taking 2.5 mg on Sunday, Tuesday, and Thursday and take 1.25 mg all other days.  Recheck INR in 1 week.  Orders given to Dennie Bible, [facility] RN.

## 2010-12-19 NOTE — Progress Notes (Signed)
Summary: test results  Phone Note Call from Patient Call back at Home Phone [phone] Call back at [phone]   Caller: Patient Reason for Call: Lab or Test Results Summary of Call: Pt will be going out town for a month after Sunday call # [phone] to give test results but before Sunday call # [phone] Initial call taken by: Judie Grieve,  April 20, 2010 8:05 AM  Follow-up for Phone Call        CBC results from 04-19-10  discussed with pt by telephone

## 2010-12-19 NOTE — Medication Information (Signed)
Summary: [facility]madin Clinic  Anticoagulant Therapy  Managed by: Shelby Dubin, PharmD, BCPS, CPP Referring MD: Rollene Rotunda, MD PCP: Dr. Leandrew Koyanagi MD: Clifton James MD, Cristal Deer Indication 1: Atrial Fibrillation Lab Used: [facility] [place] Site: [address] PT 34.2 INR POC 2.9 INR RANGE 2 - 3           Allergies: No Known Drug Allergies  Anticoagulation Management History:      His anticoagulation is being managed by telephone today.  Positive risk factors for bleeding include an age of 75 years or older.  The bleeding index is 'intermediate risk'.  Positive CHADS2 values include History of CHF, History of HTN, and Age > 75 years old.  Prothrombin time is 34.2.  Anticoagulation responsible provider: Clifton James MD, Cristal Deer.  INR POC: 2.9.  Exp: 02/2011.    Anticoagulation Management Assessment/Plan:      The patient's current anticoagulation dose is Warfarin sodium 2.5 mg tabs: Use as directed by anticoagulation clinic..  The target INR is 2.0-3.0.  The next INR is due 01/24/2010.  Anticoagulation instructions were given to Dennie Bible, [facility] RN.  Results were reviewed/authorized by Shelby Dubin, PharmD, BCPS, CPP.  He was notified by Shelby Dubin PharmD, BCPS, CPP.         Prior Anticoagulation Instructions: INR 4.2  Skip coumadin today and tomorrow.  Then start NEW dosing schedule of 1.25 mg on Monday and Friday and take 2.5  mg all other days.  Recheck in 1 week on Tuesday 01/17/10. Orders given to Ardis Hughs, [facility] RN  Current Anticoagulation Instructions: INR 2.9  Reduce to 1.25 mg Monday, Wednesday, Friday and 2.5 mg daily.    Recheck in 1 week.  [facility] will keep until 4/6).  Orders to Dennie Bible, Charity fundraiser.

## 2010-12-19 NOTE — Progress Notes (Signed)
Summary: seen on yesterday, has question  Phone Note Call from Patient Call back at Home Phone [phone]   Caller: Patient Reason for Call: Talk to Nurse Details for Reason: pt was seen on yesterday has question.  Initial call taken by: Lorne Skeens,  December 13, 2009 1:55 PM  Follow-up for Phone Call        pt called stating that he doesn't feel well and is tired since starting metoprolol yesterday.  Explained to pt that it is normal for him to feel that way because the medication causes his BP to decrease and HR to slow down.  His body will adjust in time to the change.  Pt states understanding and will call back if s/s don't  get any better in a few weeks. Follow-up by: Charolotte Capuchin, RN,  December 13, 2009 2:29 PM     Appended Document: seen on yesterday, has question Conversation noted.

## 2010-12-19 NOTE — Progress Notes (Signed)
Summary: Nuclear Pre-Procedure  Phone Note Outgoing Call   Call placed by: Milana Na, EMT-P,  January 17, 2010 3:02 PM Summary of Call: Reviewed information on Myoview Information Sheet (see scanned document for further details).  Spoke with patient.     Nuclear Med Background Indications for Stress Test: Evaluation for Ischemia  Indications Comments: 12/20/09 [facility] DOE FATIGUE/AFIB 12/25/09 [facility] SYNCOPE/AFIB MILD INCREASE IN CARDIAC ENZYMES  History: Cardioversion, Echo, GXT  History Comments: '85 GXT (+) ST changes with no further testing 12/22/09 ECHO 50-55% AFIB 12/22/09 AND 02/07/11CARDIOVERSION-AFIB  Symptoms: DOE, Fatigue, Syncope    Nuclear Pre-Procedure Cardiac Risk Factors: Hypertension, Lipids Height (in): 67  Nuclear Med Study Referring MD:  [facility]

## 2010-12-19 NOTE — Miscellaneous (Signed)
Summary: [facility] Orders   [facility] Orders   Imported By: Roderic Ovens 01/17/2010 16:31:48  _____________________________________________________________________  External Attachment:    Type:   Image     Comment:   External Document

## 2010-12-19 NOTE — Medication Information (Signed)
Summary: rov/sp  Anticoagulant Therapy  Managed by: Charolotte Eke, PharmD Referring MD: [facility] PCP: Dr. Doreatha Martin MD: Ladona Ridgel MD, Sharlot Gowda Indication 1: Atrial Fibrillation Lab Used: [facility] [place] Site: [address] INR POC 1.7 INR RANGE 2 - 3  Dietary changes: no    Health status changes: no    Bleeding/hemorrhagic complications: no    Recent/future hospitalizations: no    Any changes in medication regimen? no    Recent/future dental: no  Any missed doses?: no       Is patient compliant with meds? yes      Comments: Starting Pradaxa tonight.  Current Medications (verified): 1)  Lipitor 10 Mg Tabs (Atorvastatin Calcium) .Marland Kitchen.. 1 By Mouth Daily 2)  Multivitamins   Tabs (Multiple Vitamin) .Marland Kitchen.. 1 By Mouth Daily 3)  Aspirin 81 Mg Tbec (Aspirin) .... Take One Tablet By Mouth Daily 4)  Lumigan 0.03 % Soln (Bimatoprost) .... As Directed 5)  Nitrostat 0.4 Mg Subl (Nitroglycerin) .Marland Kitchen.. 1 Tablet Under Tongue At Onset of Chest Pain; You May Repeat Every 5 Minutes For Up To 3 Doses. 6)  Amiodarone Hcl 200 Mg Tabs (Amiodarone Hcl) .Marland Kitchen.. 1 Tab By Mouth Once Daily 7)  Diltiazem Hcl Er Beads 180 Mg Xr24h-Cap (Diltiazem Hcl Er Beads) .... Take One Capsule By Mouth Daily 8)  Potassium Chloride Crys Cr 20 Meq Cr-Tabs (Potassium Chloride Crys Cr) .Marland Kitchen.. 1 Tab By Mouth Once Daily 9)  Furosemide 40 Mg Tabs (Furosemide) .... Take One Tablet By Mouth Daily. 10)  Benazepril Hcl 10 Mg Tabs (Benazepril Hcl) .... One Tab By Mouth Once Daily 11)  Pradaxa 150 Mg Caps (Dabigatran Etexilate Mesylate) .... Take 1 Capsule By Mouth Two Times A Day  Allergies (verified): No Known Drug Allergies  Anticoagulation Management History:      The patient is taking warfarin and comes in today for a routine follow up visit.  Positive risk factors for bleeding include an age of 75 years or older.  The bleeding index is 'intermediate risk'.  Positive CHADS2 values include History of CHF,  History of HTN, and Age > 49 years old.  Anticoagulation responsible provider: Ladona Ridgel MD, Sharlot Gowda.  INR POC: 1.7.  Cuvette Lot#: [id number].  Exp: 06/2011.    Anticoagulation Management Assessment/Plan:      The target INR is 2.0-3.0.  The next INR is due 04/21/2010.  Anticoagulation instructions were given to Dennie Bible, [facility] RN.  Results were reviewed/authorized by Charolotte Eke, PharmD.  He was notified by Charolotte Eke, PharmD.         Prior Anticoagulation Instructions: INR 2.5  Continue taking 2.5 mg on Sunday, Tuesday, and thursday and 1.25 mg all other days.  Return to clinic in 3 weeks.    Current Anticoagulation Instructions: Stopped Coumadin. May begin Pradaxa since INR <2.  F/u labs scheduled for 04/19/10.

## 2010-12-19 NOTE — Assessment & Plan Note (Signed)
Summary: eph/per dc summary   Primary Provider:  Dr. Perrin Maltese  CC:  check up.  History of Present Illness: 77 yo with paroxysmal atrial fibrillation and diastolic CHF presents for followup.  He was hospitalized twice in 2/11 with atrial fibrillation and rapid ventricular response.  He has been cardioverted twice now and remains in NSR today.  I have started him on amiodarone.  He was diuresed in the hospital and has lost 9 lbs compared to his weight at last clinic appointment.  He has been doing well and is back home by himself now.  His son lives nearby.  He has been trying to exercise again: he is riding his exercise bike for 10 minutes at a time and has been walking about 1/2 mile a day. By the end of that time, his legs get tired.  No significant dyspnea with exertion and no chest pain.  Of note, his cardiac enzymes were mildly elevated when he was in the hospital with afib/RVR and CHF.  He feels like his heart has been regular.    Labs (2/11): BNP 170, K 4.3, creatinine 1.1, LDL 77, HDL 70, TSH normal  ECG: NSR with PACs  Current Medications (verified): 1)  Lipitor 10 Mg Tabs (Atorvastatin Calcium) .Marland Kitchen.. 1 By Mouth Daily 2)  Multivitamins   Tabs (Multiple Vitamin) .Marland Kitchen.. 1 By Mouth Daily 3)  Aspirin 81 Mg Tbec (Aspirin) .... Take One Tablet By Mouth Daily 4)  Warfarin Sodium 2.5 Mg Tabs (Warfarin Sodium) .... Use As Directed By Anticoagulation Clinic. 5)  Lumigan 0.03 % Soln (Bimatoprost) .... As Directed 6)  Nitrostat 0.4 Mg Subl (Nitroglycerin) .Marland Kitchen.. 1 Tablet Under Tongue At Onset of Chest Pain; You May Repeat Every 5 Minutes For Up To 3 Doses. 7)  Amiodarone Hcl 200 Mg Tabs (Amiodarone Hcl) .Marland Kitchen.. 1 Tab By Mouth Once Daily 8)  Diltiazem Hcl Er Beads 180 Mg Xr24h-Cap (Diltiazem Hcl Er Beads) .... Take One Capsule By Mouth Daily 9)  Potassium Chloride Crys Cr 20 Meq Cr-Tabs (Potassium Chloride Crys Cr) .Marland Kitchen.. 1 Tab By Mouth Once Daily 10)  Furosemide 40 Mg Tabs (Furosemide) .... Take One Tablet By  Mouth Daily.  Allergies: No Known Drug Allergies  Past History:  Past Medical History: 1. Hypertension  2. Hyperlipidemia 3. History of nonsustained ventricular tachycardia in 2004 after wrist surgery. 4. History of prostate cancer, status post radioactive seeds and now on Lupron therapy. 5. Diastolic CHF: Echo (2/11) with EF 50-55%, mild left atrial enlargement.  6. Atrial fibrillation: Poorly tolerated, rapid response and develops CHF.   Family History: Reviewed history from 12/12/2009 and no changes required. Mother died at age 2.  Father died at age 88 due to  Parkinson disease.  There is no family history of premature coronary artery disease.  Social History: Reviewed history from 12/12/2009 and no changes required. He lives in [place].  He he is a widower and retired. Denies any tobacco use.  He drinks alcohol socially.  Review of Systems       All systems reviewed and negative except as per HPI.   Vital Signs:  Patient profile:   77 year old male Height:      67 inches Weight:      150 pounds Pulse rate:   64 / minute Resp:     18 per minute BP sitting:   197 / 99  (left arm)  Vitals Entered By: Burnett Kanaris, CNA (January 04, 2010 8:12 AM)  Physical Exam  General:  Well developed, well nourished, in no acute distress. Neck:  Neck supple, no JVD. No masses, thyromegaly or abnormal cervical nodes. Lungs:  Clear bilaterally to auscultation and percussion. Heart:  Non-displaced PMI, chest non-tender; regular rate and rhythm, S1, S2 without murmurs, rubs. +S4. Carotid upstroke normal, no bruit.  Pedals normal pulses. 1+ edema 1/2 up lower legs bilaterally.  Abdomen:  Bowel sounds positive; abdomen soft and non-tender without masses, organomegaly, or hernias noted. No hepatosplenomegaly. Extremities:  No clubbing or cyanosis. Neurologic:  Alert and oriented x 3. Psych:  Normal affect.   Impression & Recommendations:  Problem # 1:  ATRIAL FIBRILLATION  (ICD-427.31) Maintaining NSR on amiodarone today.  He will continue coumadin, diltiazem CD, and amiodarone.  TSH and LFTs are normal.  Will need baseline PFTs with amiodarone use.   Problem # 2:  HYPERTENSION (ICD-401.9) BP is quite elevated today, initially 197/99, rechecked 166/80.  Patient has been checking BP at home and it has been running 130s-140s systolic.  He is on diltiazem CD only for BP now.  He was on benazepril prior to hospitalizations, so will restart this at 10 mg daily.  BMET in 2 wks.  Will call patient for BP check in 2 wks.    Problem # 3:  DIASTOLIC HEART FAILURE, CHRONIC (ICD-428.32) Patient has lost significant weight with diuresis.  He looks close to euvolemic today with JVP normal.  Continue current dose of Lasix with KCl.   Problem # 4:  ELEVATED TROPONIN Patient had mild elevation in cardiac enzymes with atrial fibrillation/RVR and acute diastolic CHF.  This was most likely due to demand ischemia.  Will get Lexiscan myoview to rule out significant ischemia.  Continue ASA 81 mg for now, can stop (and just take coumadin) if no significant myoview defect.   Followup in 1 month  Other Orders: Nuclear Stress Test (Nuc Stress Test) TLB-BMP (Basic Metabolic Panel-BMET) (80048-METABOL) TLB-BNP (B-Natriuretic Peptide) (83880-BNPR) TLB-TSH (Thyroid Stimulating Hormone) (84443-TSH) TLB-Hepatic/Liver Function Pnl (80076-HEPATIC) TLB-Lipid Panel (80061-LIPID)  Patient Instructions: 1)  Your physician recommends that you schedule a follow-up appointment in: 1 month. 2)  Your physician recommends that you have lab work today: lipid/liver/bmet/tsh/bnp (427.31;401.9;428.32) 3)  Labwork in 10 days- bmet (427.31;401.9;428.32) 4)  Your physician has recommended you make the following change in your medication: 1) Start benazepril 10mg  once daily  5)  Your physician has requested that you have a lexiscan myoview.  For further information please visit [email or web address]  Please  follow instruction sheet, as given. 6)  We will call you in 2 weeks to followup on your blood pressure readings.  Prescriptions: BENAZEPRIL HCL 10 MG TABS (BENAZEPRIL HCL) one tab by mouth once daily  #30 x 6   Entered by:   Sherri Rad, RN, BSN   Authorized by:   Marca Ancona, MD   Signed by:   Sherri Rad, RN, BSN on 01/04/2010   Method used:   Electronically to        [id number] (retail)       [address]       [place], [place]  [address]       Ph: [phone] or [phone]       Fax: [phone]   RxID:   [id number]

## 2010-12-19 NOTE — Progress Notes (Signed)
Summary: pt wants ato talk about new medication-change to Pradaxa 03-31-10  Phone Note Call from Patient Call back at Home Phone [phone]   Caller: Patient Reason for Call: Talk to Nurse, Talk to Doctor Summary of Call: pt heard of a new medication that has just come out and he wants to talk to someone about it Initial call taken by: Omer Semir,  Mar 31, 2010 3:10 PM  Follow-up for Phone Call        talked with patient by telephone --asking if he could change from Warfarin to Pradaxa --I will forward to  Dr Shirlee Latch for review     Appended Document: pt wants ato talk about new medication-change to Pradaxa 03-31-10 He can change to Pradaxa.  Can start 150 mg two times a day when INR < 2 after stopping coumadin.   Appended Document: pt wants ato talk about new medication-change to Pradaxa 03-31-10 LMTCB  Appended Document: pt wants ato talk about new medication-change to Pradaxa 03-31-10 Spoke with pt.  Sent Rx for Pradaxa to his pharmacy.  He has been instructed to hold his Coumadin today and tomorrow and we will recheck his INR on Thursday to make sure INR is <2. Thurston Hole, RN aware and will schedule f/u labwork.

## 2010-12-19 NOTE — Miscellaneous (Signed)
Summary: [facility] Orders  [facility] Orders   Imported By: Roderic Ovens 04/06/2010 12:42:11  _____________________________________________________________________  External Attachment:    Type:   Image     Comment:   External Document

## 2010-12-19 NOTE — Medication Information (Signed)
Summary: NEW AFIB/STARTED 5MG / ON 1/24/SAF  Anticoagulant Therapy  Managed by: Bethena Midget, RN, BSN Referring MD: Rollene Rotunda, MD PCP: Dr. Perrin Maltese Indication 1: Atrial Fibrillation Lab Used: [facility] [place] Site: [address] INR POC 2.2 INR RANGE 2 - 3  Dietary changes: no    Health status changes: no    Bleeding/hemorrhagic complications: no    Recent/future hospitalizations: no    Any changes in medication regimen? no    Recent/future dental: no  Any missed doses?: no       Is patient compliant with meds? yes      Comments: Started 5mg s daily on 12/13/09.   MTM Completed:  suggest decr aspirin 325 mg to 81 or d/c.  Reduce amlodipine/benazepril 5-10 to lower based on BP.    Allergies (verified): No Known Drug Allergies  Anticoagulation Management History:      The patient is taking warfarin and comes in today for a routine follow up visit.  Positive risk factors for bleeding include an age of 75 years or older.  The bleeding index is 'intermediate risk'.  Positive CHADS2 values include History of HTN and Age > 101 years old.  INR POC: 2.2.  Cuvette Lot#: [id number].  Exp: 02/2011.    Anticoagulation Management Assessment/Plan:      The patient's current anticoagulation dose is Warfarin sodium 5 mg tabs: one by mouth daily or as directed.  The next INR is due 12/23/2009.  Anticoagulation instructions were given to patient.  Results were reviewed/authorized by Bethena Midget, RN, BSN.  He was notified by Bethena Midget, RN, BSN.         Current Anticoagulation Instructions: INR 2.2  Reduce warfarin to 0.5 tab = 2.5 mg daily.  Recheck in 1 week.

## 2010-12-19 NOTE — Assessment & Plan Note (Signed)
Summary: 4 month rov/sl   Visit Type:  4 months follow up Primary Provider:  Dr. Cleta Alberts  CC:  No complains.  History of Present Illness: 77 yo with paroxysmal atrial fibrillation and diastolic CHF presents for followup.  He was hospitalized twice in 2/11 with atrial fibrillation and rapid ventricular response.  He has been cardioverted twice now and remains in NSR today.  I have started him on amiodarone.  He was diuresed in the hospital due to acute diastolic CHF.   He has been doing well and is back home by himself now.  Weight has been stable, down 1/2 lb since prior appointment. He has no significant exertional dyspnea and is able to climb a flight of steps.  He does all his ADLs.  No orthopnea, PND, or chest pain. His son does tell me that he is not as stable on his feet as he has been in the past though he has not fallen.  He also is starting to have some trouble with his short term memory.  He is taking Pradaxa with no bleeding sequelae.   Labs (2/11): BNP 170, K 4.3, creatinine 1.1, LDL 77, HDL 70, TSH normal, LFTs normal Labs (3/11): K 4.3, creatinine 1.1 Labs (6/11): HCT 39.2  Current Medications (verified): 1)  Lipitor 10 Mg Tabs (Atorvastatin Calcium) .Marland Kitchen.. 1 By Mouth Daily 2)  Multivitamins   Tabs (Multiple Vitamin) .Marland Kitchen.. 1 By Mouth Daily 3)  Aspirin 81 Mg Tbec (Aspirin) .... Take One Tablet By Mouth Daily 4)  Lumigan 0.03 % Soln (Bimatoprost) .... As Directed 5)  Nitrostat 0.4 Mg Subl (Nitroglycerin) .Marland Kitchen.. 1 Tablet Under Tongue At Onset of Chest Pain; You May Repeat Every 5 Minutes For Up To 3 Doses. 6)  Amiodarone Hcl 200 Mg Tabs (Amiodarone Hcl) .Marland Kitchen.. 1 Tab By Mouth Once Daily 7)  Diltiazem Hcl Er Beads 180 Mg Xr24h-Cap (Diltiazem Hcl Er Beads) .... Take One Capsule By Mouth Daily 8)  Potassium Chloride Crys Cr 20 Meq Cr-Tabs (Potassium Chloride Crys Cr) .Marland Kitchen.. 1 Tab By Mouth Once Daily 9)  Furosemide 40 Mg Tabs (Furosemide) .... Take One Tablet By Mouth Daily. 10)  Benazepril Hcl 10  Mg Tabs (Benazepril Hcl) .... One Tab By Mouth Once Daily 11)  Pradaxa 150 Mg Caps (Dabigatran Etexilate Mesylate) .... Take 1 Capsule By Mouth Two Times A Day  Allergies (verified): No Known Drug Allergies  Past History:  Past Medical History: 1. Hypertension  2. Hyperlipidemia 3. History of nonsustained ventricular tachycardia in 2004 after wrist surgery. 4. History of prostate cancer, status post radioactive seeds and now on Lupron therapy. 5. Diastolic CHF: Echo (2/11) with EF 50-55%, mild left atrial enlargement.  6. Atrial fibrillation: Poorly tolerated, rapid response and develops CHF.  He is now on amiodarone and Pradaxa.  PFTs (3/11) with mild obstructive defect, normal diffusion. 7. Elevated troponin in the setting of atrial fibrillation with RVR: Lexiscan myoview in 3/11 showed EF 76%, no ischemia or infarction.    Family History: Reviewed history from 12/12/2009 and no changes required. Mother died at age 18.  Father died at age 63 due to  Parkinson disease.  There is no family history of premature coronary artery disease.  Social History: Reviewed history from 02/13/2010 and no changes required. He lives in [place].  He he is a widower and retired. Denies any tobacco use.  He drinks alcohol socially. 2 sons, closest lives in [place].    Review of Systems       All  systems reviewed and negative except as per HPI.   Vital Signs:  Patient profile:   77 year old male Height:      67 inches Weight:      149.50 pounds BMI:     23.50 Pulse rate:   65 / minute Pulse rhythm:   regular Resp:     18 per minute BP sitting:   136 / 80  (left arm) Cuff size:   large  Vitals Entered By: Vikki Ports (June 13, 2010 1:46 PM)  Physical Exam  General:  Well developed, well nourished, in no acute distress. Neck:  Neck supple, no JVD. No masses, thyromegaly or abnormal cervical nodes. Lungs:  Clear bilaterally to auscultation and percussion. Heart:  Non-displaced PMI,  chest non-tender; regular rate and rhythm, S1, S2 without murmurs, rubs. +S4. Carotid upstroke normal, no bruit.  Pedals normal pulses. 2+ edema 1/3 up lower legs bilaterally. + varicosities.  Abdomen:  Bowel sounds positive; abdomen soft and non-tender without masses, organomegaly, or hernias noted. No hepatosplenomegaly. Extremities:  No clubbing or cyanosis. Neurologic:  Alert and oriented x 3. Psych:  Normal affect.   Impression & Recommendations:  Problem # 1:  DIASTOLIC HEART FAILURE, CHRONIC (ICD-428.32) Patient has minimal exertional shortness of breath.  He has peripheral edema but no JVD.  ? peripheral edema due to venous insufficiency.  I will leave him on the current Lasix dose.  BMET today to followup on creatinine and K.   Problem # 2:  ATRIAL FIBRILLATION (ICD-427.31) Maintaining NSR on amiodarone today.  He will continue Pradaxa, diltiazem CD, and amiodarone.  Baseline PFTs on amiodarone looked ok. He does not tolerate atrial fibrillation well (RVR with CHF).   I will have him get LFTs and TSH.  He gets yearly checkups with an ophthalmologist.  He has had no falls but per his son is not as steady as in the past.  I told him that I would like him to use a cane.   Problem # 3:  HYPERLIPIDEMIA (ICD-272.4) Good lipids when last checked, on Lipitor 10 mg daily.   Other Orders: TLB-BMP (Basic Metabolic Panel-BMET) (80048-METABOL) TLB-TSH (Thyroid Stimulating Hormone) (84443-TSH) TLB-Hepatic/Liver Function Pnl (80076-HEPATIC)  Patient Instructions: 1)  Your physician recommends that you have lab today--BMP/TSH/LFT today 427.31 428.32 2)  Your physician recommends that you schedule a follow-up appointment in: 4 months with Dr Shirlee Latch.

## 2010-12-19 NOTE — Miscellaneous (Signed)
Summary: Home Health Certification/Treatment Plan  Home Health Certification/Treatment Plan   Imported By: Roderic Ovens 01/27/2010 16:25:19  _____________________________________________________________________  External Attachment:    Type:   Image     Comment:   External Document

## 2010-12-19 NOTE — Letter (Signed)
Summary: [facility]   Imported By: Marylou Mccoy 04/21/2010 10:16:51  _____________________________________________________________________  External Attachment:    Type:   Image     Comment:   External Document

## 2010-12-19 NOTE — Medication Information (Signed)
Summary: Coumadin Clinic  Anticoagulant Therapy  Managed by: Cloyde Reams, RN, BSN Referring MD: [facility] PCP: Dr. Leandrew Koyanagi MD: Myrtis Ser MD, Tinnie Gens Indication 1: Atrial Fibrillation Lab Used: [facility] [place] Site: [address] PT 46.0 INR POC 3.8 INR RANGE 2 - 3    Bleeding/hemorrhagic complications: no       Any missed doses?: no       Is patient compliant with meds? yes      Comments: Pt had stress test last week.    Allergies: No Known Drug Allergies  Anticoagulation Management History:      His anticoagulation is being managed by telephone today.  Positive risk factors for bleeding include an age of 79 years or older.  The bleeding index is 'intermediate risk'.  Positive CHADS2 values include History of CHF, History of HTN, and Age > 51 years old.  Prothrombin time is 46.0.  Anticoagulation responsible provider: Myrtis Ser MD, Tinnie Gens.  INR POC: 3.8.  Exp: 02/2011.    Anticoagulation Management Assessment/Plan:      The patient's current anticoagulation dose is Warfarin sodium 2.5 mg tabs: Use as directed by anticoagulation clinic..  The target INR is 2.0-3.0.  The next INR is due 01/31/2010.  Anticoagulation instructions were given to Dennie Bible, [facility] RN.  Results were reviewed/authorized by Cloyde Reams, RN, BSN.  He was notified by Cloyde Reams RN.         Prior Anticoagulation Instructions: INR 2.9  Reduce to 1.25 mg Monday, Wednesday, Friday and 2.5 mg daily.    Recheck in 1 week.  [facility] will keep until 4/6).  Orders to Dennie Bible, Charity fundraiser.    Current Anticoagulation Instructions: INR 3.8  Spoke with Dennie Bible, [facility], RN while at pt's home.  Advised to hold coumadin x 1 dosage, then decr dosage to 1.25mg  daily except 2.5mg  on Su,Tu,Th.  Recheck in 1 week.

## 2010-12-19 NOTE — Medication Information (Signed)
Summary: rov/sp  Anticoagulant Therapy  Managed by: Eda Keys, PharmD Referring MD: [facility] PCP: Dr. Doreatha Martin MD: Antoine Poche MD, Fayrene Fearing Indication 1: Atrial Fibrillation Lab Used: [facility] [place] Site: [address] INR POC 2.5 INR RANGE 2 - 3  Dietary changes: no    Health status changes: no    Bleeding/hemorrhagic complications: no    Recent/future hospitalizations: no    Any changes in medication regimen? no    Recent/future dental: no  Any missed doses?: no       Is patient compliant with meds? yes      Comments: Patient is interested in Pradaxa.  Allergies: No Known Drug Allergies  Anticoagulation Management History:      The patient is taking warfarin and comes in today for a routine follow up visit.  Positive risk factors for bleeding include an age of 75 years or older.  The bleeding index is 'intermediate risk'.  Positive CHADS2 values include History of CHF, History of HTN, and Age > 79 years old.  Anticoagulation responsible provider: Antoine Poche MD, Fayrene Fearing.  INR POC: 2.5.  Cuvette Lot#: [id number].  Exp: 06/2011.    Anticoagulation Management Assessment/Plan:      The patient's current anticoagulation dose is Warfarin sodium 2.5 mg tabs: Use as directed by anticoagulation clinic..  The target INR is 2.0-3.0.  The next INR is due 04/21/2010.  Anticoagulation instructions were given to Dennie Bible, [facility] RN.  Results were reviewed/authorized by Eda Keys, PharmD.  He was notified by Eda Keys.         Prior Anticoagulation Instructions: INR 3.2  Skip today's dose of Coumadin then resume same dose of 1.25mg  every day except 2.5mg  on Sunday, Tuesday and Thursday   Current Anticoagulation Instructions: INR 2.5  Continue taking 2.5 mg on Sunday, Tuesday, and thursday and 1.25 mg all other days.  Return to clinic in 3 weeks.

## 2010-12-19 NOTE — Miscellaneous (Signed)
[facility]   Imported By: Kassie Mends 02/17/2010 09:28:20  _____________________________________________________________________  External Attachment:    Type:   Image     Comment:   External Document

## 2010-12-19 NOTE — Assessment & Plan Note (Signed)
Summary: rov/appt time is 8:30am/hm   Visit Type:  Follow-up Primary Keala Drum:  Dr. Cleta Alberts  CC:  sob occ.  History of Present Illness: 77 yo with paroxysmal atrial fibrillation and diastolic CHF presents for followup.  He was hospitalized twice in 2/11 with atrial fibrillation and rapid ventricular response.  He has been cardioverted twice now and remains in NSR today.  I have started him on amiodarone.  He was diuresed in the hospital due to acute diastolic CHF.     He has been doing well from a cardiovascular standpoint.  He walks his dog for almost a mile daily, some of the route is uphill.  He is not getting short of breath with this.  No chest pain.  He is stable on his feet and has not had any falls.  He is tolerating Pradaxa without any problems.  BP is high today (180/80), but he checks it daily at home and it has been in the 130s.  He has calibrated his cuff with his PCP.  Weight is up about 2 lbs since prior appointment.   Mr. Marone has sold his house and is going to be moving to [place] to be near his sons and grand-children.  He will be leaving next week.  He will be seeing his son's cardiologist in the future.   Labs (2/11): BNP 170, K 4.3, creatinine 1.1, LDL 77, HDL 70, TSH normal, LFTs normal Labs (3/11): K 4.3, creatinine 1.1 Labs (6/11): HCT 39.2 Labs (7/11): TSH normal, LFTs normal Labs (8/11): K 4.1, creatinine 1.1  Current Medications (verified): 1)  Lipitor 10 Mg Tabs (Atorvastatin Calcium) .Marland Kitchen.. 1 By Mouth Daily 2)  Multivitamins   Tabs (Multiple Vitamin) .Marland Kitchen.. 1 By Mouth Daily 3)  Aspirin 81 Mg Tbec (Aspirin) .... Take One Tablet By Mouth Daily 4)  Lumigan 0.03 % Soln (Bimatoprost) .... As Directed 5)  Nitrostat 0.4 Mg Subl (Nitroglycerin) .Marland Kitchen.. 1 Tablet Under Tongue At Onset of Chest Pain; You May Repeat Every 5 Minutes For Up To 3 Doses. 6)  Amiodarone Hcl 200 Mg Tabs (Amiodarone Hcl) .Marland Kitchen.. 1 Tab By Mouth Once Daily 7)  Diltiazem Hcl Er Beads 180 Mg Xr24h-Cap  (Diltiazem Hcl Er Beads) .... Take One Capsule By Mouth Daily 8)  Furosemide 40 Mg Tabs (Furosemide) .... Take One Tablet By Mouth Daily. 9)  Benazepril Hcl 10 Mg Tabs (Benazepril Hcl) .... One Tab By Mouth Once Daily 10)  Pradaxa 150 Mg Caps (Dabigatran Etexilate Mesylate) .... Take 1 Capsule By Mouth Two Times A Day  Allergies (verified): No Known Drug Allergies  Past History:  Past Medical History: 1. Hypertension  2. Hyperlipidemia 3. History of nonsustained ventricular tachycardia in 2004 after wrist surgery. 4. History of prostate cancer, status post radioactive seeds and now on Lupron therapy. 5. Diastolic CHF: Echo (2/11) with EF 50-55%, mild left atrial enlargement, no significant valvular abnormality.  6. Atrial fibrillation: Poorly tolerated, rapid response and develops CHF.  He is now on amiodarone and Pradaxa.  PFTs (3/11) with mild obstructive defect, normal diffusion. 7. Elevated troponin in the setting of atrial fibrillation with RVR: Lexiscan myoview in 3/11 showed EF 76%, no ischemia or infarction.    Family History: Reviewed history from 12/12/2009 and no changes required. Mother died at age 77.  Father died at age 26 due to  Parkinson disease.  There is no family history of premature coronary artery disease.  Social History: Reviewed history from 02/13/2010 and no changes required. He lives in  [place].  He he is a widower and retired. Denies any tobacco use.  He drinks alcohol socially. 2 sons, closest lives in [place] and the other lives in [place].    Review of Systems       All systems reviewed and negative except as per HPI.   Vital Signs:  Patient profile:   77 year old male Height:      67 inches Weight:      151 pounds BMI:     23.74 Pulse rate:   56 / minute BP sitting:   180 / 80  (left arm) Cuff size:   regular  Vitals Entered By: Burnett Kanaris, CNA (August 03, 2010 8:45 AM)  Physical Exam  General:  Well developed, well nourished,  in no acute distress. Neck:  Neck supple, no JVD. No masses, thyromegaly or abnormal cervical nodes. Lungs:  Clear bilaterally to auscultation and percussion. Heart:  Non-displaced PMI, chest non-tender; regular rate and rhythm, S1, S2 without murmurs, rubs. +S4. Carotid upstroke normal, no bruit.  Pedals normal pulses. 2+ edema 1/3 up lower legs bilaterally. + varicosities.  Abdomen:  Bowel sounds positive; abdomen soft and non-tender without masses, organomegaly, or hernias noted. No hepatosplenomegaly. Extremities:  No clubbing or cyanosis. Neurologic:  Alert and oriented x 3. Psych:  Normal affect.   Impression & Recommendations:  Problem # 1:  DIASTOLIC HEART FAILURE, CHRONIC (ICD-428.32) Patient has minimal exertional shortness of breath.  He has peripheral edema but no JVD.  ? peripheral edema due to venous insufficiency versus diltiazem use.  I will leave him on the current Lasix dose and will keep him on diltiazem for now.  May want to try stopping diltiazem if his edema worsens.  Recent creatinine looked ok.   Problem # 2:  ATRIAL FIBRILLATION (ICD-427.31) Maintaining NSR on amiodarone today.  He will continue Pradaxa, diltiazem CD, and amiodarone.  Baseline PFTs on amiodarone looked ok. He does not tolerate atrial fibrillation well (RVR with CHF).  LFTs and TSH have been normal.  He gets yearly checkups with an ophthalmologist.  He has been steady on his feet with no falls.   Problem # 3:  HYPERLIPIDEMIA (ICD-272.4) He will need lipids/LFTs checked soon, he can do this in [place] since he is moving next week.   Problem # 4:  HYPERTENSION (ICD-401.9) I am going to continue current antihypertensive doses for now as patient's BP is in the 130s systolic at home and he is moving next week.   It has been a pleasure to take care of this gentleman.  We will be glad to send any records needed to his new doctors.

## 2011-01-29 ENCOUNTER — Telehealth: Payer: Self-pay | Admitting: Cardiology

## 2011-02-06 NOTE — Progress Notes (Signed)
Summary: pt stay with Brilyn Tuller  Phone Note Call from Patient Call back at Home Phone [phone]   Caller: Patient Summary of Call: pt states he did not move to [place] and he will keep dr. Shirlee Latch as his doctor. pt will call in later for an appt. Initial call taken by: Roe Coombs,  January 29, 2011 10:47 AM     Appended Document: pt stay with Paisley Grajeda make sure we get him a followup when due.   Appended Document: pt stay with Riddick Nuon LMTCB to schedule fasting L/L profile and appt with Dr Shirlee Latch  Appended Document: pt stay with Jarae Panas I talked with pt--pt states he was in [place] for several months-but is back in [place] now--he was in the hospital in [place] in January for dehydration and was told his heart was good-he was going to see the cardiologist in [place] in 4 months --I will schedule an appt for pt with Dr Shirlee Latch for April 2012--he did not want to schedule lab until he saw Dr Shirlee Latch   Appended Document: pt stay with Sharonne Ricketts appt 03/07/11 with Dr Shirlee Latch

## 2011-02-07 LAB — CBC
HCT: 42.5 % (ref 39.0–52.0)
HCT: 42.7 % (ref 39.0–52.0)
HCT: 44 % (ref 39.0–52.0)
Hemoglobin: 14.4 g/dL (ref 13.0–17.0)
Hemoglobin: 14.4 g/dL (ref 13.0–17.0)
Hemoglobin: 14.9 g/dL (ref 13.0–17.0)
Hemoglobin: 15.8 g/dL (ref 13.0–17.0)
Hemoglobin: 16.5 g/dL (ref 13.0–17.0)
MCHC: 33.3 g/dL (ref 30.0–36.0)
MCV: 93.9 fL (ref 78.0–100.0)
MCV: 94.2 fL (ref 78.0–100.0)
MCV: 94.8 fL (ref 78.0–100.0)
Platelets: 137 10*3/uL — ABNORMAL LOW (ref 150–400)
Platelets: 143 10*3/uL — ABNORMAL LOW (ref 150–400)
Platelets: 149 10*3/uL — ABNORMAL LOW (ref 150–400)
Platelets: 150 10*3/uL (ref 150–400)
Platelets: 152 10*3/uL (ref 150–400)
Platelets: 154 10*3/uL (ref 150–400)
RBC: 4.16 MIL/uL — ABNORMAL LOW (ref 4.22–5.81)
RBC: 4.31 MIL/uL (ref 4.22–5.81)
RBC: 4.51 MIL/uL (ref 4.22–5.81)
RBC: 4.54 MIL/uL (ref 4.22–5.81)
RBC: 4.57 MIL/uL (ref 4.22–5.81)
RBC: 4.61 MIL/uL (ref 4.22–5.81)
RBC: 4.93 MIL/uL (ref 4.22–5.81)
RBC: 5.28 MIL/uL (ref 4.22–5.81)
RDW: 16 % — ABNORMAL HIGH (ref 11.5–15.5)
WBC: 5 10*3/uL (ref 4.0–10.5)
WBC: 5.1 10*3/uL (ref 4.0–10.5)
WBC: 5.4 10*3/uL (ref 4.0–10.5)
WBC: 5.5 10*3/uL (ref 4.0–10.5)
WBC: 5.6 10*3/uL (ref 4.0–10.5)
WBC: 5.6 10*3/uL (ref 4.0–10.5)
WBC: 5.8 10*3/uL (ref 4.0–10.5)
WBC: 6.6 10*3/uL (ref 4.0–10.5)
WBC: 7.3 10*3/uL (ref 4.0–10.5)

## 2011-02-07 LAB — BASIC METABOLIC PANEL
BUN: 14 mg/dL (ref 6–23)
BUN: 14 mg/dL (ref 6–23)
BUN: 19 mg/dL (ref 6–23)
BUN: 21 mg/dL (ref 6–23)
CO2: 30 mEq/L (ref 19–32)
Calcium: 8.2 mg/dL — ABNORMAL LOW (ref 8.4–10.5)
Calcium: 8.4 mg/dL (ref 8.4–10.5)
Calcium: 8.6 mg/dL (ref 8.4–10.5)
Chloride: 100 mEq/L (ref 96–112)
Chloride: 102 mEq/L (ref 96–112)
Chloride: 104 mEq/L (ref 96–112)
Chloride: 98 mEq/L (ref 96–112)
Creatinine, Ser: 0.88 mg/dL (ref 0.4–1.5)
Creatinine, Ser: 0.97 mg/dL (ref 0.4–1.5)
Creatinine, Ser: 1.12 mg/dL (ref 0.4–1.5)
Creatinine, Ser: 1.17 mg/dL (ref 0.4–1.5)
GFR calc Af Amer: >60 mL/min (ref >60)
GFR calc Af Amer: >60 mL/min (ref >60)
GFR calc Af Amer: >60 mL/min (ref >60)
GFR calc Af Amer: >60 mL/min (ref >60)
GFR calc Af Amer: >60 mL/min (ref >60)
GFR calc Af Amer: >60 mL/min (ref >60)
GFR calc Af Amer: >60 mL/min (ref >60)
GFR calc Af Amer: >60 mL/min (ref >60)
GFR calc non Af Amer: >60 mL/min (ref >60)
GFR calc non Af Amer: >60 mL/min (ref >60)
GFR calc non Af Amer: >60 mL/min (ref >60)
GFR calc non Af Amer: >60 mL/min (ref >60)
GFR calc non Af Amer: >60 mL/min (ref >60)
GFR calc non Af Amer: >60 mL/min (ref >60)
Glucose, Bld: 121 mg/dL — ABNORMAL HIGH (ref 70–99)
Potassium: 3.6 mEq/L (ref 3.5–5.1)
Potassium: 3.7 mEq/L (ref 3.5–5.1)
Potassium: 3.8 mEq/L (ref 3.5–5.1)
Potassium: 3.9 mEq/L (ref 3.5–5.1)
Potassium: 3.9 mEq/L (ref 3.5–5.1)
Potassium: 4.8 mEq/L (ref 3.5–5.1)
Sodium: 133 mEq/L — ABNORMAL LOW (ref 135–145)
Sodium: 135 mEq/L (ref 135–145)
Sodium: 137 mEq/L (ref 135–145)
Sodium: 138 mEq/L (ref 135–145)
Sodium: 139 mEq/L (ref 135–145)
Sodium: 140 mEq/L (ref 135–145)

## 2011-02-07 LAB — PROTIME-INR
INR: 1.58 — ABNORMAL HIGH (ref 0.00–1.49)
INR: 1.9 — ABNORMAL HIGH (ref 0.00–1.49)
INR: 2.1 — ABNORMAL HIGH (ref 0.00–1.49)
INR: 2.14 — ABNORMAL HIGH (ref 0.00–1.49)
INR: 2.18 — ABNORMAL HIGH (ref 0.00–1.49)
INR: 2.45 — ABNORMAL HIGH (ref 0.00–1.49)
INR: 2.51 — ABNORMAL HIGH (ref 0.00–1.49)
INR: 3.43 — ABNORMAL HIGH (ref 0.00–1.49)
Prothrombin Time: 18.7 seconds — ABNORMAL HIGH (ref 11.6–15.2)
Prothrombin Time: 23.7 seconds — ABNORMAL HIGH (ref 11.6–15.2)
Prothrombin Time: 26.4 seconds — ABNORMAL HIGH (ref 11.6–15.2)
Prothrombin Time: 26.9 seconds — ABNORMAL HIGH (ref 11.6–15.2)
Prothrombin Time: 34.3 seconds — ABNORMAL HIGH (ref 11.6–15.2)

## 2011-02-07 LAB — DIFFERENTIAL
Basophils Absolute: 0 10*3/uL (ref 0.0–0.1)
Basophils Relative: 0 % (ref 0–1)
Basophils Relative: 0 % (ref 0–1)
Lymphocytes Relative: 16 % (ref 12–46)
Lymphocytes Relative: 8 % — ABNORMAL LOW (ref 12–46)
Lymphs Abs: 0.8 10*3/uL (ref 0.7–4.0)
Lymphs Abs: 0.9 10*3/uL (ref 0.7–4.0)
Monocytes Absolute: 0.7 10*3/uL (ref 0.1–1.0)
Monocytes Relative: 12 % (ref 3–12)
Monocytes Relative: 9 % (ref 3–12)
Neutro Abs: 4 10*3/uL (ref 1.7–7.7)
Neutro Abs: 5.3 10*3/uL (ref 1.7–7.7)
Neutro Abs: 5.8 10*3/uL (ref 1.7–7.7)
Neutrophils Relative %: 72 % (ref 43–77)
Neutrophils Relative %: 79 % — ABNORMAL HIGH (ref 43–77)
Neutrophils Relative %: 81 % — ABNORMAL HIGH (ref 43–77)

## 2011-02-07 LAB — COMPREHENSIVE METABOLIC PANEL
Albumin: 3.2 g/dL — ABNORMAL LOW (ref 3.5–5.2)
BUN: 17 mg/dL (ref 6–23)
Chloride: 95 mEq/L — ABNORMAL LOW (ref 96–112)
Creatinine, Ser: 0.92 mg/dL (ref 0.4–1.5)
Glucose, Bld: 102 mg/dL — ABNORMAL HIGH (ref 70–99)
Total Bilirubin: 0.8 mg/dL (ref 0.3–1.2)

## 2011-02-07 LAB — CARDIAC PANEL(CRET KIN+CKTOT+MB+TROPI)
CK, MB: 1.8 ng/mL (ref 0.3–4.0)
CK, MB: 4.3 ng/mL — ABNORMAL HIGH (ref 0.3–4.0)
CK, MB: 5.3 ng/mL — ABNORMAL HIGH (ref 0.3–4.0)
Relative Index: INVALID (ref 0.0–2.5)
Relative Index: INVALID (ref 0.0–2.5)
Total CK: 44 U/L (ref 7–232)
Total CK: 70 U/L (ref 7–232)
Troponin I: 0.17 ng/mL — ABNORMAL HIGH (ref 0.00–0.06)
Troponin I: 0.19 ng/mL — ABNORMAL HIGH (ref 0.00–0.06)
Troponin I: 0.2 ng/mL — ABNORMAL HIGH (ref 0.00–0.06)

## 2011-02-07 LAB — HEPARIN LEVEL (UNFRACTIONATED)
Heparin Unfractionated: 0.35 IU/mL (ref 0.30–0.70)
Heparin Unfractionated: 0.71 IU/mL — ABNORMAL HIGH (ref 0.30–0.70)
Heparin Unfractionated: 0.83 IU/mL — ABNORMAL HIGH (ref 0.30–0.70)

## 2011-02-07 LAB — APTT: aPTT: 43 seconds — ABNORMAL HIGH (ref 24–37)

## 2011-02-07 LAB — CK TOTAL AND CKMB (NOT AT ARMC)
CK, MB: 5.7 ng/mL — ABNORMAL HIGH (ref 0.3–4.0)
Total CK: 96 U/L (ref 7–232)

## 2011-02-07 LAB — BRAIN NATRIURETIC PEPTIDE: Pro B Natriuretic peptide (BNP): 776 pg/mL — ABNORMAL HIGH (ref 0.0–100.0)

## 2011-02-07 LAB — TROPONIN I
Troponin I: 0.2 ng/mL — ABNORMAL HIGH (ref 0.00–0.06)
Troponin I: 0.28 ng/mL — ABNORMAL HIGH (ref 0.00–0.06)

## 2011-02-07 LAB — POCT CARDIAC MARKERS
CKMB, poc: 1.3 ng/mL (ref 1.0–8.0)
Myoglobin, poc: 96.6 ng/mL (ref 12–200)

## 2011-02-17 ENCOUNTER — Other Ambulatory Visit: Payer: Self-pay | Admitting: *Deleted

## 2011-02-17 MED ORDER — ATORVASTATIN CALCIUM 10 MG PO TABS: 10.0000 mg | ORAL_TABLET | Freq: Every day | ORAL | Status: DC

## 2011-02-21 ENCOUNTER — Telehealth: Payer: Self-pay | Admitting: Cardiology

## 2011-02-21 NOTE — Telephone Encounter (Signed)
Pt calling to let you know he has not been on cholesterol med for a month, though he was taking it but realized it was vitamins, doesn't need refill says he has plenty, has appt with dr Shirlee Latch 4-18, does he need a sooner appt? Pt denies any problems.

## 2011-02-21 NOTE — Telephone Encounter (Signed)
I talked with pt by telephone. He has missed taking Lipitor for the last month. He just overlooked taking it. He will restart Lipitor 10mg  daily today. He has an appointment with Dr Shirlee Latch 03/07/11

## 2011-03-06 ENCOUNTER — Encounter: Payer: Self-pay | Admitting: Cardiology

## 2011-03-07 ENCOUNTER — Encounter: Payer: Self-pay | Admitting: Cardiology

## 2011-03-07 ENCOUNTER — Ambulatory Visit (INDEPENDENT_AMBULATORY_CARE_PROVIDER_SITE_OTHER): Payer: Medicare Other | Admitting: Cardiology

## 2011-03-07 VITALS — BP 141/73 | HR 51 | Ht 66.0 in | Wt 146.0 lb

## 2011-03-07 DIAGNOSIS — E785 Hyperlipidemia, unspecified: Secondary | ICD-10-CM

## 2011-03-07 DIAGNOSIS — R0602 Shortness of breath: Secondary | ICD-10-CM

## 2011-03-07 DIAGNOSIS — E7800 Pure hypercholesterolemia, unspecified: Secondary | ICD-10-CM

## 2011-03-07 DIAGNOSIS — I509 Heart failure, unspecified: Secondary | ICD-10-CM

## 2011-03-07 DIAGNOSIS — I5032 Chronic diastolic (congestive) heart failure: Secondary | ICD-10-CM

## 2011-03-07 DIAGNOSIS — I4891 Unspecified atrial fibrillation: Secondary | ICD-10-CM

## 2011-03-07 LAB — LIPID PANEL
Cholesterol: 172 mg/dL (ref 0–200)
Triglycerides: 46 mg/dL (ref 0.0–149.0)
VLDL: 9.2 mg/dL (ref 0.0–40.0)

## 2011-03-07 LAB — HEPATIC FUNCTION PANEL
ALT: 24 U/L (ref 0–53)
AST: 30 U/L (ref 0–37)
Albumin: 3.5 g/dL (ref 3.5–5.2)
Alkaline Phosphatase: 69 U/L (ref 39–117)
Total Protein: 6.5 g/dL (ref 6.0–8.3)

## 2011-03-07 LAB — CBC WITH DIFFERENTIAL/PLATELET
Basophils Absolute: 0 10*3/uL (ref 0.0–0.1)
HCT: 37.6 % — ABNORMAL LOW (ref 39.0–52.0)
Lymphocytes Relative: 10.7 % — ABNORMAL LOW (ref 12.0–46.0)
Lymphs Abs: 0.5 10*3/uL — ABNORMAL LOW (ref 0.7–4.0)
Monocytes Relative: 10.9 % (ref 3.0–12.0)
Neutrophils Relative %: 77.7 % — ABNORMAL HIGH (ref 43.0–77.0)
Platelets: 214 10*3/uL (ref 150.0–400.0)
RDW: 18.3 % — ABNORMAL HIGH (ref 11.5–14.6)

## 2011-03-07 LAB — BASIC METABOLIC PANEL
CO2: 27 mEq/L (ref 19–32)
Calcium: 9.1 mg/dL (ref 8.4–10.5)
Glucose, Bld: 91 mg/dL (ref 70–99)
Sodium: 137 mEq/L (ref 135–145)

## 2011-03-07 MED ORDER — WARFARIN SODIUM 2.5 MG PO TABS: 2.5000 mg | ORAL_TABLET | Freq: Every day | ORAL | Status: DC

## 2011-03-07 NOTE — Assessment & Plan Note (Signed)
Patient is not in atrial fibrillation today (he is in an ectopic atrial rhythm with rate in the 50s).  He tolerates atrial fibrillation poorly.  I will have him continue amiodarone 100 mg daily as well as diltiazem CD.   - Will need to follow heart rate carefully, can stop diltiazem if heart rate goes any lower.   - He is not on anticoagulation.  No GI bleeding and he is steady on his feet.  Pradaxa may have been stopped due to renal function.  I will have him start on coumadin given his significant stroke risk (will avoid Pradaxa for now as his GFR is low).  - Henry Meyers can stop aspirin since we are starting coumadin.  - Check CBC today since we are starting coumadin.

## 2011-03-07 NOTE — Telephone Encounter (Signed)
I talked with pt's son,Mark about pt restarting Coumadin

## 2011-03-07 NOTE — Patient Instructions (Addendum)
Start Coumadin (warfarin) 2.5mg  daily.  Take your last dose of Aspirin this Friday March 09, 2011. Do not take Aspirin after this Friday.  Lab today--bmp/bnp/CBC/lipid profile/liver profile  427.31  428.32  You have an appointment in the Coumadin Clinic this Monday April 23,2012 at 1:45pm.  They will check and monitor your coumadin(warfarin) level. Call [phone] if you need to change this appointment.  Schedule an appointment with Dr Shirlee Latch in 3 months.

## 2011-03-07 NOTE — Assessment & Plan Note (Signed)
Checking lipids/LFTs today.

## 2011-03-07 NOTE — Telephone Encounter (Signed)
LMTCB

## 2011-03-07 NOTE — Progress Notes (Signed)
77 yo with paroxysmal atrial fibrillation and diastolic CHF presents for followup.  He was hospitalized twice in 2/11 with atrial fibrillation and rapid ventricular response.  While in the hospital, he was diuresed for acute diastolic CHF.  He has been cardioverted twice now and remains in NSR today. He is on amiodarone.  Mr Schmader moved to [place] late last year to live near his sons.  However, he missed his friends in [place] and recently moved back to [place] to an assisted living.  While in [place], he was hospitalized once for syncope and dehydration.  He is no longer taking Pradaxa or Lasix and is now taking spironolactone three times a week.   He did not have any bleeding complications that he can remember to necessitate stopping Pradaxa.  It is possible that that it was stopped due to acute renal failure with his episode of dehydration.   Since returning to [place], he has done quite well.  He walks his dog daily for exercise.  No dyspnea walking on flat ground or up a flight of steps.  No chest pain.  Main complaint is low back pain.  No falls.  He is steady on his feet.  Weight is down 5 lbs since most recent prior appointment in this office.   ECG: Ectopic atrial rhythm with rate 51, otherwise normal ECG.   Labs (2/11): BNP 170, K 4.3, creatinine 1.1, LDL 77, HDL 70, TSH normal, LFTs normal Labs (3/11): K 4.3, creatinine 1.1 Labs (6/11): HCT 39.2 Labs (7/11): TSH normal, LFTs normal Labs (8/11): K 4.1, creatinine 1.1 Labs (12/11): K 4.9, creatinine 1.4, HCT 38.5, LFTs normal, TSH normal  Allergies (verified):  No Known Drug Allergies  Past Medical History: 1. Hypertension  2. Hyperlipidemia 3. History of nonsustained ventricular tachycardia in 2004 after wrist surgery. 4. History of prostate cancer, status post radioactive seeds and now on Lupron therapy. 5. Diastolic CHF: Echo (2/11) with EF 50-55%, mild left atrial enlargement, no significant valvular  abnormalities. 6. Atrial fibrillation: Poorly tolerated, rapid response and develops CHF.  He is now on amiodarone.  PFTs (3/11) with mild obstructive defect, normal diffusion.  Pradaxa was stopped in [place], ? due to renal function.  No history of GI bleeding.  7. Elevated troponin in the setting of atrial fibrillation with RVR: Lexiscan myoview in 3/11 showed EF 76%, no ischemia or infarction.    Family History: Mother died at age 51.  Father died at age 33 due to  Parkinson disease.  There is no family history of premature coronary artery disease.  Social History: He lives in [place].  He he is a widower and retired. Denies any tobacco use.  He drinks alcohol socially. 2 sons, closest lives in [place] and the other lives in [place].   He is now in an assisted living facility.   Review of Systems        All systems reviewed and negative except as per HPI.   Current Outpatient Prescriptions  Medication Sig Dispense Refill  . amiodarone (PACERONE) 200 MG tablet Take 100 mg by mouth daily.       Marland Kitchen atorvastatin (LIPITOR) 10 MG tablet Take 1 tablet (10 mg total) by mouth daily.  30 tablet  11  . benazepril (LOTENSIN) 10 MG tablet Take 10 mg by mouth daily.        . bimatoprost (LUMIGAN) 0.03 % ophthalmic drops as directed.        . diltiazem (CARDIZEM) 120 MG  tablet Take 120 mg by mouth daily.        . Multiple Vitamin (MULTIVITAMIN) tablet Take 1 tablet by mouth daily.        . nitroGLYCERIN (NITROSTAT) 0.4 MG SL tablet Place 0.4 mg under the tongue every 5 (five) minutes as needed.        Marland Kitchen SPIRONOLACTONE PO Take by mouth. M,W,F       . DISCONTD: aspirin 81 MG tablet Take 81 mg by mouth daily.        Marland Kitchen warfarin (COUMADIN) 2.5 MG tablet Take 1 tablet (2.5 mg total) by mouth daily.  30 tablet  0  . DISCONTD: dabigatran (PRADAXA) 150 MG CAPS Take 150 mg by mouth every 12 (twelve) hours.        Marland Kitchen DISCONTD: diltiazem (CARDIZEM CD) 180 MG 24 hr capsule Take 180 mg by mouth daily.        Marland Kitchen DISCONTD: furosemide (LASIX) 40 MG tablet Take 40 mg by mouth daily.          BP 141/73  Pulse 51  Ht 5\' 6"  (1.676 m)  Wt 146 lb (66.225 kg)  BMI 23.56 kg/m2 General:  Well developed, well nourished, in no acute distress. Neck:  Neck supple, no JVD. No masses, thyromegaly or abnormal cervical nodes. Lungs:  Clear bilaterally to auscultation and percussion. Heart:  Non-displaced PMI, chest non-tender; regular rate and rhythm, S1, S2 without murmurs, rubs. +S4. Carotid upstroke normal, no bruit.  Pedals normal pulses. 1+ edema 1/2 up lower legs bilaterally. + varicosities.  Abdomen:  Bowel sounds positive; abdomen soft and non-tender without masses, organomegaly, or hernias noted. No hepatosplenomegaly. Extremities:  No clubbing or cyanosis. Neurologic:  Alert and oriented x 3. Psych:  Normal affect.

## 2011-03-07 NOTE — Assessment & Plan Note (Signed)
Minimal volume overload.  He was apparently taken off Lasix due to dehydration.  He is now on spironolactone 3 times a week.  This is an unusual regimen.  I will get a BMET and BNP today.  In the future, I may have him stop spironolactone and go on a low dose of Lasix, but as he is stable, I am not going to change things today.

## 2011-03-09 ENCOUNTER — Telehealth: Payer: Self-pay | Admitting: Cardiology

## 2011-03-09 NOTE — Telephone Encounter (Signed)
The pt's son called our office because he is concerned about the pt starting coumadin with his GI history.  Per Kathlene November (son in [place]) the pt was hospitalized in January of this year with a GI bleed due to colitis. At that time the pt was taking Pradaxa and this was discontinued due to bleed.  Per Kathlene November the pt called him last night and made him aware that Dr Shirlee Latch had prescribed coumadin.  The pt has not started coumadin at this time and Kathlene November would like the pt to remain off of this med until Dr Shirlee Latch is made aware of this information.  Kathlene November states that he also has AFib and he is aware of the risk of stroke if not taking a blood thinner.  Kathlene November also states that the pt to his knowledge did not make Dr Shirlee Latch aware of this information during the office visit. I made Kathlene November aware that Dr Shirlee Latch is off today and will be back in the office next week.   Kathlene November would like a call back next week in regards to this issue. I will forward this message to Dr Shirlee Latch and Katina Dung RN.

## 2011-03-09 NOTE — Telephone Encounter (Signed)
That is fine.  He should stay on aspirin and not take coumadin.  He told me he was in the hospital for dehydration.  Did not say anything about GI bleed.

## 2011-03-09 NOTE — Telephone Encounter (Signed)
I spoke with Henry Meyers and made him aware of Dr Alford Highland recommendation. Henry Meyers would also like me to call the pt and make him aware of recommendations.  I left a detailed message on the pt's home number and asked him to contact our office.  I also cancelled the coumadin clinic appt on 03/12/11.

## 2011-03-09 NOTE — Telephone Encounter (Signed)
Pt was put on coumadin at last visit and son wants to talk to someone right away because dad has colitis and earlier this year he had a serious bleed from his colon so son doesn't think he should be on a blood thinner.

## 2011-03-12 ENCOUNTER — Encounter: Payer: Medicare Other | Admitting: *Deleted

## 2011-04-04 ENCOUNTER — Telehealth: Payer: Self-pay | Admitting: Cardiology

## 2011-04-04 NOTE — Telephone Encounter (Signed)
I talked with pt. Pt states weight yesterday 144.1 and today weight 148.  Pt denies SOB, any significant change in LE edema, orthopnea or abdominal distention. Pt states he does not feel like he has extra fluid. Pt states his normal weight range is 145-150. Pt took spironolactone today. He is asking if he should take an extra spironolactone. I will review with Dr Shirlee Latch.

## 2011-04-04 NOTE — Telephone Encounter (Signed)
Pt calling re weight increase 4 lbs

## 2011-04-04 NOTE — Telephone Encounter (Signed)
I reviewed with Dr Shirlee Latch. He did not recommend extra Spironolactone today. He recommended pt continue to weigh himself daily and call if his weight continues to increase. Pt  will call if he has any increase in SOB or LE edema. Pt states he did feel nauseated today and vomited after he ate breakfast this morning. He also states he had some diarrhea earlier but that has resolved now. Pt states he will see Dr Perrin Maltese if he continues to have GI symptoms and continues to feels bad.

## 2011-04-06 NOTE — Discharge Summary (Signed)
NAME:  Henry Meyers, Henry Meyers                          ACCOUNT NO.:  [id number]   MEDICAL RECORD NO.:  [id number]                   PATIENT TYPE:  INP   LOCATION:  3708                                 FACILITY:  [facility]   PHYSICIAN:  Maylon Peppers. Waynette Buttery, M.D.               DATE OF BIRTH:  Apr 04, 1920   DATE OF ADMISSION:  01/18/2003  DATE OF DISCHARGE:  01/19/2003                                 DISCHARGE SUMMARY   DISCHARGE DIAGNOSES:  1. Cardiac dysrhythmia.  2. Near syncope.  3. Hypertension.  4. Hyperlipidemia.   DISCHARGE MEDICATIONS:  1. Lotrel 10 mg 1 tablet p.o. q.a.m.  2. Dyazide 12.5 mg 1 tablet p.o. q.a.m.  3. Lipitor 10 mg 1 tablet p.o. q.h.s.  4. Tylenol 1000 mg t.i.d.   DISCHARGE INSTRUCTIONS:  The patient was told not to lift any heavy objects  in regards to the left wrist.  The patient has follow-up appointment for a  echocardiogram on January 22, 2003 at 9 a.m. at [facility].  The  patient is to report to the cardiopulmonary office at 8:45 a.m. and his  echocardiogram results will be read by the [facility].   HOSPITAL COURSE:  The patient is an 77 year old white male who comes in on  January 18, 2003 after having left wrist pain for fractured wrist on January 18, 2003 at 9 in the morning.  The patient was sent home after surgery and took  some Percocet and subsequently his wife found him difficult to arouse.  The  patient vomited x 1.  Wife contacted EMS.  The patient was brought to the  ER.  The patient had a run of asymptomatic monomorphic ventricular  tachycardia while in the ER.  The patient was admitted for 24-hour  observation, placed on telemetry, and pulse oximeter.   The patient in the hospital subsequently had no more ventricular  tachycardia, was clinically stable, and no new complaints overnight.  No  vomiting, no chest pain, no shortness of breath, and no more loss of  consciousness or near syncope.   The patient's last set of labs was drawn on  January 18, 2003.  Sodium 138,  potassium 3.8, chloride 105, bicarbonate 26, and BUN 22.  Glucose 130.  Hemoglobin 15, hematocrit 44.  No consults were done and no studies were  done.   On January 19, 2003, we tried to get an echocardiogram but echocardiogram was  too backed up.  So, outpatient echocardiogram was set up for patient on  January 22, 2003 and the patient was informed on how to get there.  The patient  was stable upon leaving the hospital.  Maylon Peppers Waynette Buttery, M.D.    SAG/MEDQ  D:  01/19/2003  T:  01/19/2003  Job:  [id number]   cc:   Jonita Albee, M.D.  [facility]  [address]  [place]  [place] [address]  Fax: [phone]

## 2011-04-06 NOTE — Op Note (Signed)
[facility]  Patient:    Henry Meyers, Henry Meyers Visit Number: [id number] MRN: [id number]          Service Type: DSU Location: [facility] Attending Physician:  Susa Day Proc. Date: 07/22/01 Admit Date:  07/22/2001                             Operative Report  PREOPERATIVE DIAGNOSES:  Large mucous cyst, dorsal ulnar aspect right long finger distal interphalangeal joint, with significant degenerative arthritis noted radiographically at the distal interphalangeal joint with large marginal osteophytes on the dorsal aspect of the distal and middle phalanges.  POSTOPERATIVE DIAGNOSES:  Large mucous cyst, dorsal ulnar aspect right long finger distal interphalangeal joint, with significant degenerative arthritis noted radiographically at the distal interphalangeal joint with large marginal osteophytes on the dorsal aspect of the distal and middle phalanges.  PROCEDURES: 1. Excision of mucous cyst. 2. Debridement of osteophytes on adjacent surfaces of distal and middle    phalanges at dorsal ulnar aspect of distal interphalangeal joint.  SURGEON:  Katy Fitch. Sypher, Montez Hageman., M.D.  ASSISTANT:  Jonni Sanger, P.A.  ANESTHESIA:  0.25% Marcaine and 2% lidocaine metacarpal-head level block, right long finger, supplemented by IV sedation supervised by the anesthesiologist, Guadalupe Maple, M.D.  INDICATIONS:  Henry Meyers is a 77 year old man who was referred by Dr. Robert Bellow for evaluation and management of an enlarging mucous cyst on the dorsal ulnar aspect of his right long finger DIP joint.  This was quite sizeable, more than 1 cm, and tenting the skin to the point of impending rupture.  He had underlying arthritis of the DIP joint.  We recommended an excision and DIP joint debridement.  Preoperatively, he understood we could not resolve the arthritis of the DIP joint but could potentially resolve the chronic mucous cyst  drainage predicament.  DESCRIPTION OF PROCEDURE:  Henry Meyers was brought to the operating room and placed in the supine position upon the operating table.  Following light sedation, the right arm was prepped with Betadine soap and solution and sterilely draped.  Marcaine 0.25% and 2% lidocaine were infiltrated at the metacarpal head level to obtain a digital block.  When anesthesia was satisfactory, the finger was exsanguinated with a gauze wrap, and a half-inch Penrose drain was used as an arterial tourniquet at the base of the proximal phalanx.  The procedure commenced with partial excision of the skin overlying the cyst, followed by a circumferential dissection of the cyst and removal of the cyst membrane with a rongeur.  The capsule on the ulnar aspect of the DIP joint was incised, removing the capsule between the extensor tendon terminal slip and the radial collateral ligament.  A loose body was removed from the DIP joint, followed by use of a microcurette to remove the adjacent osteophytes on the distal and middle phalanx on the ulnar aspect of the joint.  The joint was then thoroughly irrigated with sterile saline, followed by repair of the skin with mattress sutures of 5-0 nylon.  There were no apparent complications.  Henry Meyers had essentially bone-on-bone arthropathy at his DIP noted with direct arthrotomy.  For aftercare, he was given prescriptions for Percocet 5 mg one tablet p.o. q.4-6h. pain, also Keflex 500 mg one p.o. q.8h. x 4 days as a prophylactic antibiotic. Attending Physician:  Susa Day DD:  07/22/01 TD:  07/22/01 Job: [id number]

## 2011-04-06 NOTE — Op Note (Signed)
   NAME:  Henry Meyers, Henry Meyers                          ACCOUNT NO.:  [id number]   MEDICAL RECORD NO.:  [id number]                   PATIENT TYPE:  AMB   LOCATION:  DSC                                  FACILITY:  [facility]   PHYSICIAN:  Consuello Bossier., M.D.         DATE OF BIRTH:  1920-04-09   DATE OF PROCEDURE:  07/07/2003  DATE OF DISCHARGE:                                 OPERATIVE REPORT   PREOPERATIVE DIAGNOSIS:  Melanoma in situ, left medial cheek lateral nasal  area.   POSTOPERATIVE DIAGNOSIS:  Melanoma in situ, left medial cheek lateral nasal  area.   OPERATION:  Wide local excisional biopsy, 1 cm, with closure of 2 cm defect  in junction between left cheek and left nasal area.   SURGEON:  Pleas Patricia, M.D.   ANESTHESIA:  Xylocaine 2% with epinephrine 1:100,000.   FINDINGS:  The patient had a recent biopsy of a pigmented lesion of his left  lower eyelid and cheek area, and it was bordering close to the nasal area.  He had had another lesion excised just beneath this, and this was somewhat  of a surprise findings of having this more superior lesion be a melanoma in  situ.  There was no evidence of any pigmentary aspects until present.  A  wide local excision of this area including the area beneath it, which had  previously been biopsied, was performed with a primary closure of the 2 cm  in length defect.   DESCRIPTION OF PROCEDURE:  The patient was brought to the operating room,  marked off for the elliptical excision of the previous biopsy area, which  had been marked off in Dr. Sherryl Barters office.  He was prepped with Betadine  and draped sterilely.  He was anesthetized with Xylocaine 2% with  epinephrine 1:100,000.  Following the onset of anesthesia, a full-thickness  excisional biopsy was performed.  Bleeding was controlled with the eye  cautery unit.  The wound was then approximated with interrupted and running  6-0 Prolene, producing the above linear defect.  There  was no definite  ectropion.  Neosporin ointment, a light compression dressing were applied.  The patient tolerated the procedure well and will return in one week for  suture removal or before if there are any problems.                                               Consuello Bossier., M.D.    HH/MEDQ  D:  07/07/2003  T:  07/08/2003  Job:  [id number]

## 2011-04-06 NOTE — Letter (Signed)
March 03, 2007    Jonita Albee, M.D.  [facility]  [address]  [place],  [place] [address]   RE:  KALON, ERHARDT  MRN:  [id number]  /  DOB:  1920-07-02   Dear Thayer Ohm:   It was a pleasure to see your patient, friend and neighbor and talk to  you about him today.   Mr. 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  He had a syncopal episode one day in  February.  He had been having a drink.  You and your mom came over to  visit and he had another drink with you.  He became sick to his stomach  shortly after you left, got up to go to his study, became increasingly  nauseated, felt cold and then collapsed to the floor.  His wife thinks  that he passed out.  He vomited once or twice.  By the time you arrived,  he was awake.  His wife describes him being very pale prior to your  arrival.  You noted an irregular pulse.  EMS was called.  We do not have  vital signs from them.  He was brought to hospital and he was then after  followed along without any further untoward events.  He underwent  evaluation at the hospital with a normal echo.  Outpatient Myoview was  scheduled but he failed to make it for that appointment as his wife was  in hospital.  (I saw his wife in hospital while Dr. Daleen Squibb was away and  put her on amiodarone and apparently she is doing quite well with this,  although she remains quite concerned about the side effects of the  drug.)   PAST MEDICAL HISTORY:  1. Hypertension.  2. Dyslipidemia.  3. He also has a history of prostate cancer and radioactive seeds and      is now on Lupron therapy.  4. There is a comment in the chart also about nonsustained ventricular      tachycardia in 2004.   PAST SURGICAL HISTORY:  Broken wrist.   CURRENT MEDICATIONS:  1. Lumigan eye drops.  2. Aspirin 325.  3. Hydrochlorothiazide 12.5.  4. Lotrel 5/10.  5. Lipitor 10.  6. Lupron.   ALLERGIES:  NO KNOWN DRUG  ALLERGIES.   REVIEW OF SYSTEMS:  In addition to the above, is largely negative across  multiple organ systems.   SOCIAL HISTORY:  He is married.  He has no children.  He and his wife  live next to you, as you know.  He does not use cigarettes or  recreational drugs.  He does have a scotch and water most every evening.   PHYSICAL EXAMINATION:  GENERAL APPEARANCE:  He is an elderly Caucasian  male with hearing aids, appearing his stated age of 31.  VITAL SIGNS:  Blood pressure 129/80, pulse 85.  HEENT:  No icterus or xanthoma.  NECK:  The neck veins were flat.  Carotids were brisk and full  bilaterally without bruits.  BACK:  Without kyphosis or scoliosis.  LUNGS:  Clear.  CARDIOVASCULAR:  Heart sounds were irregular with an early systolic  murmur.  ABDOMEN:  Soft with active bowel sounds with a broad abdominal  pulsation.  PERIPHERAL VASCULAR:  Femoral pulses were 2+, distal pulses were intact.  EXTREMITIES:  No clubbing, cyanosis, or edema.  NEUROLOGIC:  Grossly normal.  SKIN:  Warm and dry.  Electrocardiogram dated today demonstrated sinus rhythm at 85 with  intervals of 0.14/0.10/0.36.  The P-wave axis was quite unusual.  There  were multiple P-waves seen.   IMPRESSION:  1. Wandering atrial pacemaker.  2. Syncope, probably neurally mediated.  3. Hypertension.  4. History of nonsustained ventricular tachycardia, question      documentation.  5. Abdominal pulsation.   Thayer Ohm, Mr. Kneece has had a syncopal episode that was probably neurally  mediated, the exact trigger of which is not clear.  His cardiac  evaluation is sufficiently low risk that further pursuit of his syncope,  I do not think, is warranted at this time.   Two issues, however, are begged by his presentation.  The first is  whether he has atrial fibrillation.  I will look into the engineering  structures of the atrial fibrillation recorder.  We need to find how it  measures and classifies irregular RR  intervals to see whether it would  be of value.  The last issue, Thayer Ohm, is that he has a broad abdominal  pulsation and I failed to mention this to him, but we will plan to get  him back and do an ultrasound of his abdominal aorta.   Thayer Ohm, thanks very much for asking [place] to see him.  Mr. Bernier did  mention that your daughter has just had her child, so congratulations on  your being a grandfather.    Sincerely,      Duke Salvia, MD, [facility]  Electronically Signed    SCK/MedQ  DD: 03/03/2007  DT: 03/03/2007  Job #: [id number]

## 2011-04-06 NOTE — H&P (Signed)
NAMEDANTHONY, Meyers NO.:  [id number]   MEDICAL RECORD NO.:  [id number]          PATIENT TYPE:  EMS   LOCATION:  MAJO                         FACILITY:  [facility]   PHYSICIAN:  Bevelyn Buckles. Bensimhon, MDDATE OF BIRTH:  10-13-1920   DATE OF ADMISSION:  01/02/2007  DATE OF DISCHARGE:                              HISTORY & PHYSICAL   PRIMARY CARE PHYSICIAN:  Jonita Albee, M.D.   CARDIOLOGIST:  He is new to [facility].   REASON FOR ADMISSION:  Chills, weakness, and diaphoresis.   [name] is a delightful 77 year old male who is a close personal  friend of Dr. Robert Bellow.  He has a history of hypertension,  hyperlipidemia.  He denies any history of known cardiac disease.  He had  a remote stress test approximately 20 years ago which was negative.  He  does have a history of nonsustained ventricular tachycardia after  orthopedic surgery in 2004.  He saw Dr. Graciela Husbands.  Echo at that time showed  an EF of 55-65% with no significant valvular abnormalities.  He is very  healthy at baseline and exercises five days a week at [facility] without  any functional limitations.  He says he was feeling fine today.  He had  dinner with Dr. Robert Bellow and his wife.  Approximately an hour after  dinner, he began to feel cold and had chills.  Then he became profoundly  weak and diaphoretic and had to lie down on the floor.  He became sick  to his stomach and then vomited.  He called Dr. Perrin Maltese back who came to  see him.  At that time, Mr. Walberg appeared very weak, diaphoretic.  He  had a very irregular pulse.  He was brought to the emergency room at  [facility] and finally transferred over to [facility].  He denies any chest pain or shortness of breath.  There was  no loss of consciousness.  He has not had any urinary symptoms.  He has  had recently developed a small rash on his buttocks which is concerned  for shingles and was started on Valtrex this  afternoon.  He has not had  any diarrhea.  There is no hematemesis.   REVIEW OF SYSTEMS:  As per HPI and problem list, otherwise all systems  negative.   PAST MEDICAL HISTORY:  1. Hypertension.  2. Hyperlipidemia.  3. History of nonsustained ventricular tachycardia in 2004.  4. History of prostate cancer, status post radioactive seeds and now      on Lupron therapy.   CURRENT MEDICATIONS:  1. Lumigan eye drops.  2. Aspirin 325.  3. Hydrochlorothiazide 12.5.  4. Lotrel 5/10.  5. Lipitor 10.  6. Calcium.  7. Multivitamin.  8. Vitamin C.  9. Lupron.   SOCIAL HISTORY:  He lives in [place] with his wife.  Denies any  tobacco use.  He drinks alcohol socially.   FAMILY HISTORY:  Mother died at age 20.  Father died at age 36 due to  Parkinson disease.  There is no family history of premature  coronary  artery disease.   PHYSICAL EXAMINATION:  GENERAL:  He is well-appearing.  He is slightly  hard of hearing.  He is lying flat in bed in no acute distress.  Respirations are unlabored.  VITAL SIGNS:  Blood pressure is 128/80.  Heart rate is 81.  His  temperature is 98.4.  He is sating 97% on room air.  HEENT:  Sclerae are anicteric.  EOMI.  There are no xanthelasma.  He  does have a well healed scar from an excision of a skin cancer on his  left cheek.  Oropharynx is clear.  Mucous membranes are moist.  NECK:  Supple.  There is JVD to about 6-or-7-cmH20 with a prominent CV  waves.  CARDIAC:  He has a regular rhythm with an S4.  There is no obvious  murmur.  LUNGS:  Clear.  ABDOMEN:  Soft, nontender, nondistended.  No hepatosplenomegaly.  No  bruits.  No masses.  Good bowel sounds.  EXTREMITIES:  Warm with no cyanosis, clubbing, or edema.  Distal pulses  are 1+ bilaterally.  On his buttocks there are a few lesions concerning  for localized shingles.  There is no CVA tenderness.  NEUROLOGIC:  He is alert and oriented  x3.  Cranial nerves II-XII are  grossly intact.  He moves all  four extremities without difficulty.  Affect is very pleasant.   EKG shows wondering atrial pacemaker, rate of 86.  There are a very  minimal nonspecific ST-T wave abnormalities.  Axes and intervals are  normal.  Labs are pending.   ASSESSMENT:  1. Weakness, diaphoresis, and nausea.  My main concern here is      possible infectious process but it is also possible that there may      be a cardiac source, either ischemia or arrhythmia.  We will admit      him.  Watch him on the monitor.  We will check labs, a urinalysis,      and urine culture, a TSH, cycle his cardiac enzymes, and check an      echocardiogram.  If this workup is negative, we will probably put      him to undertake stress testing probably as an outpatient.  2. For his shingles, we will continue him on Valtrex.      Bevelyn Buckles. Bensimhon, MD  Electronically Signed     DRB/MEDQ  D:  01/02/2007  T:  01/02/2007  Job:  [id number]

## 2011-04-06 NOTE — Discharge Summary (Signed)
Henry Meyers, Henry Meyers NO.:  [id number]   MEDICAL RECORD NO.:  [id number]          PATIENT TYPE:  INP   LOCATION:  3704                         FACILITY:  [facility]   PHYSICIAN:  Bevelyn Buckles. Bensimhon, MDDATE OF BIRTH:  1919-12-06   DATE OF ADMISSION:  01/02/2007  DATE OF DISCHARGE:  01/03/2007                               DISCHARGE SUMMARY   PRIMARY CARDIOLOGIST:  Dr. Arvilla Meres   PRIMARY CARE PHYSICIAN:  Dr. Thayer Ohm Guest   PROCEDURES PERFORMED DURING HOSPITALIZATION:  1. Echocardiogram.        A:  Overall left ventricular systolic function normal, left  ventricular ejection fraction was estimated at 65%, there were no left  ventricular regional wall motion abnormalities, left ventricular was  thickness was mildly to moderately increased, there was moderate mitral  annular calcification, the right ventricle was mildly dilated, there was  moderate tricuspid valvular regurgitation, the right atrium was mild to  moderately dilated.   PRIMARY DIAGNOSES:  Episode of weakness and diaphoresis, noncardiac in  etiology.   SECONDARY DIAGNOSES:  1. Prior history of ventricular arrhythmias status post orthopedic      surgery in 2004.  2. Hypertension.  3. Hyperlipidemia.  4. History of prostate cancer status post radioactive seeds and now on      Lupron therapy.   HISTORY OF PRESENT ILLNESS:  This is an 77 year old very pleasant,  Caucasian male patient who is a close personal friend of Dr. Robert Bellow  with above-mentioned diagnoses.  The patient had been seen in the past  by Dr. Graciela Husbands secondary to a nonsustained ventricular tachycardia after  orthopedic surgery in 2004, but has not followed since that time.   The patient states after he began to have dinner he felt cold and had  chills, he became profoundly weak and diaphoretic and had to lie down on  the floor.  He then became sick to his stomach.  He was very weak and  diaphoretic.  The patient was brought to  [facility] emergency room and  transferred to [facility] at the request of Dr. Perrin Maltese who attended to  the patient in his home.   The patient has been recently started on Valtrex earlier the same day  for development of a small rash on his buttocks which was concerning for  shingles per Dr. Perrin Maltese.  The patient has not had any other symptoms  prior to this time.   HOSPITAL COURSE:  The patient was seen and evaluated by Dr. Arvilla Meres in the emergency room of [facility].  The patient was  evaluated for an infectious disease along with evaluation for cardiac  enzymes to rule out cardiac etiology for symptoms.  The patient was not  found to have any arrhythmias during hospitalization.  The patient was  continued on Valtrex during his hospitalization secondary to the  diagnosis of shingles.   The patient did well through the following 24 hours with no recurrence  of symptoms, no evidence of arrhythmia and no evidence of cardiac  etiology for patient's symptoms.  The patient was seen and examined  by  Dr. Gala Romney on the day of discharge and found to be stable.  A phone  call was made to Dr. Mila Homer per Dr. Gala Romney to follow up  and inform Dr. Perrin Maltese of patient's status and that he would be going  home.   LABS ON DISCHARGE:  Hemoglobin 13.8, hematocrit 40.8, white blood cells  9.4, platelets 138, PT 13.8, INR 1.0, PTT 26, sodium 139, potassium 3.6,  chloride 103, CO2 28, glucose 143, BUN 19, creatinine 0.93, total  protein 5.6, blood albumin was 3.2, calcium 8.8, magnesium 1.9, amylase  54, lipase 24, BNP 94, troponin 0.02 and 0.02, respectively, his CK-MB  was 4.6 initially and 2.9 on discharge.   Chest x-ray completed on January 02, 2007 on admission revealed no  acute disease.   EKG on discharge revealed normal sinus rhythm with occasional PACs.   VITAL SIGNS ON DISCHARGE:  Patient's blood pressure was 107/61,  temperature 97.9, pulse 70, patient was at 96% on  room air and the  patient weighed 70.4 kilograms.   FOLLOWUP APPOINTMENTS AND PLANS:  1. The patient will be followed at the [facility] office for      adenosine stress test on Friday, February 22, at 9:30 a.m.  2. The patient will follow with Dr. Gala Romney in our [facility]      office on February 29 at 10:45 a.m.  3. The patient is to follow up with Dr. Perrin Maltese and make an appointment      through his office at his discretion.   DISCHARGE MEDICATIONS:  1. Lumigan eye drops as directed.  2. Aspirin 325 once a day.  3. HCTZ 12.5 mg daily.  4. Lotrel 5/10 mg daily.  5. Lipitor 10 mg daily.  6. Calcium tablets daily.  7. Vitamin C daily.   ALLERGIES:  No known drug allergies.   Time spent with the patient to include physician time is 35 minutes.      Bettey Mare. Lyman Bishop, NP      Bevelyn Buckles. Bensimhon, MD  Electronically Signed    KML/MEDQ  D:  01/03/2007  T:  01/04/2007  Job:  [id number]   cc:   Jonita Albee, M.D.

## 2011-04-11 ENCOUNTER — Ambulatory Visit: Payer: Medicare Other | Admitting: Cardiology

## 2011-04-12 ENCOUNTER — Telehealth: Payer: Self-pay | Admitting: Cardiology

## 2011-04-12 MED ORDER — AMIODARONE HCL 200 MG PO TABS: 100.0000 mg | ORAL_TABLET | Freq: Every day | ORAL | Status: DC

## 2011-04-12 NOTE — Telephone Encounter (Signed)
[facility] on [address]/ fax number [phone]  Has been trying to fax and not going thru.  Pt needs Amiodarone 200mg .  They need new pres. For this.  Pt has 2 tablets left. Needs this called in as soon as possible.

## 2011-04-12 NOTE — Telephone Encounter (Signed)
rx sent in to pharmacy. Pt notified

## 2011-04-18 ENCOUNTER — Other Ambulatory Visit: Payer: Self-pay | Admitting: *Deleted

## 2011-04-26 ENCOUNTER — Other Ambulatory Visit: Payer: Self-pay | Admitting: *Deleted

## 2011-04-26 MED ORDER — BENAZEPRIL HCL 10 MG PO TABS: 10.0000 mg | ORAL_TABLET | Freq: Every day | ORAL | Status: DC

## 2011-06-15 ENCOUNTER — Encounter: Payer: Self-pay | Admitting: Cardiology

## 2011-06-20 ENCOUNTER — Ambulatory Visit (INDEPENDENT_AMBULATORY_CARE_PROVIDER_SITE_OTHER): Payer: Medicare Other | Admitting: Cardiology

## 2011-06-20 ENCOUNTER — Encounter: Payer: Self-pay | Admitting: Cardiology

## 2011-06-20 DIAGNOSIS — I10 Essential (primary) hypertension: Secondary | ICD-10-CM

## 2011-06-20 DIAGNOSIS — I509 Heart failure, unspecified: Secondary | ICD-10-CM

## 2011-06-20 DIAGNOSIS — I4891 Unspecified atrial fibrillation: Secondary | ICD-10-CM

## 2011-06-20 DIAGNOSIS — I5022 Chronic systolic (congestive) heart failure: Secondary | ICD-10-CM

## 2011-06-20 DIAGNOSIS — E785 Hyperlipidemia, unspecified: Secondary | ICD-10-CM

## 2011-06-20 DIAGNOSIS — I5032 Chronic diastolic (congestive) heart failure: Secondary | ICD-10-CM

## 2011-06-20 DIAGNOSIS — R0602 Shortness of breath: Secondary | ICD-10-CM

## 2011-06-20 DIAGNOSIS — E7800 Pure hypercholesterolemia, unspecified: Secondary | ICD-10-CM

## 2011-06-20 LAB — BASIC METABOLIC PANEL
Chloride: 94 mEq/L — ABNORMAL LOW (ref 96–112)
GFR: 62.39 mL/min (ref >60.00)
Potassium: 4.2 mEq/L (ref 3.5–5.1)
Sodium: 133 mEq/L — ABNORMAL LOW (ref 135–145)

## 2011-06-20 LAB — TSH: TSH: 1.06 u[IU]/mL (ref 0.35–5.50)

## 2011-06-20 NOTE — Patient Instructions (Signed)
Please have blood work drawn today  Continue medications as listed.  Follow up in 4 months with Dr Shirlee Latch.

## 2011-06-21 LAB — HEPATIC FUNCTION PANEL
ALT: 22 U/L (ref 0–53)
AST: 29 U/L (ref 0–37)
Alkaline Phosphatase: 63 U/L (ref 39–117)
Bilirubin, Direct: 0.1 mg/dL (ref 0.0–0.3)
Total Bilirubin: 1 mg/dL (ref 0.3–1.2)

## 2011-06-21 NOTE — Assessment & Plan Note (Signed)
Minimal volume overload.  He was apparently taken off Lasix due to dehydration over the winter.  He is now on spironolactone 3 times a week. I will get a BMET and BNP today.  In the future, I may have him stop spironolactone and go on a low dose of Lasix, but as he is stable, I am not going to change things today.

## 2011-06-21 NOTE — Progress Notes (Signed)
PCP: Dr. Perrin Maltese  77 yo with paroxysmal atrial fibrillation and diastolic CHF presents for followup.  He was hospitalized twice in 2/11 with atrial fibrillation and rapid ventricular response.  While in the hospital, he was diuresed for acute diastolic CHF.  He has been cardioverted twice now and remains in NSR today. He is on amiodarone.  He is now living in an assisted living.  He was on Pradaxa in the past but this was stopped after an episode of significant GI bleeding, and he is on aspirin only at this time.  Symptomatically doing well. No tachypalpitations.   He walks his dog daily for exercise.  No dyspnea walking on flat ground or up a flight of steps.  No chest pain.  Main complaint is low back pain.  No falls.  He is steady on his feet.  Weight is down 2 lbs since most recent prior appointment in this office.   Labs (2/11): BNP 170, K 4.3, creatinine 1.1, LDL 77, HDL 70, TSH normal, LFTs normal Labs (3/11): K 4.3, creatinine 1.1 Labs (6/11): HCT 39.2 Labs (7/11): TSH normal, LFTs normal Labs (8/11): K 4.1, creatinine 1.1 Labs (12/11): K 4.9, creatinine 1.4, HCT 38.5, LFTs normal, TSH normal Labs (4/12): LFTs normal, LDL 86, HDL 77, BNP 206, K 4.6, creatinine 1.2  Allergies (verified):  No Known Drug Allergies  Past Medical History: 1. Hypertension  2. Hyperlipidemia 3. History of nonsustained ventricular tachycardia in 2004 after wrist surgery. 4. History of prostate cancer, status post radioactive seeds and now on Lupron therapy. 5. Diastolic CHF: Echo (2/11) with EF 50-55%, mild left atrial enlargement, no significant valvular abnormalities. 6. Atrial fibrillation: Poorly tolerated, rapid response and develops CHF.  He is now on amiodarone.  PFTs (3/11) with mild obstructive defect, normal diffusion.  Pradaxa was stopped in [place] in 1/12 because of GI bleed.  7. Elevated troponin in the setting of atrial fibrillation with RVR: Lexiscan myoview in 3/11 showed EF 76%, no ischemia  or infarction.   8. GI bleed in the setting of colitis.  Pradaxa stopped (1/12).   Family History: Mother died at age 62.  Father died at age 37 due to  Parkinson disease.  There is no family history of premature coronary artery disease.  Social History: He lives in [place].  He he is a widower and retired. Denies any tobacco use.  He drinks alcohol socially. 2 sons, closest lives in [place] and the other lives in [place].   He is now in an assisted living facility.   Review of Systems        All systems reviewed and negative except as per HPI.   Current Outpatient Prescriptions  Medication Sig Dispense Refill  . amiodarone (PACERONE) 200 MG tablet Take 0.5 tablets (100 mg total) by mouth daily.  30 tablet  6  . aspirin 81 MG tablet Take 81 mg by mouth daily.        Marland Kitchen atorvastatin (LIPITOR) 10 MG tablet Take 1 tablet (10 mg total) by mouth daily.  30 tablet  11  . benazepril (LOTENSIN) 10 MG tablet Take 1 tablet (10 mg total) by mouth daily.  30 tablet  6  . bimatoprost (LUMIGAN) 0.03 % ophthalmic drops as directed.        . diltiazem (CARDIZEM) 120 MG tablet Take 120 mg by mouth daily.        . Multiple Vitamin (MULTIVITAMIN) tablet Take 1 tablet by mouth daily.        Marland Kitchen  nitroGLYCERIN (NITROSTAT) 0.4 MG SL tablet Place 0.4 mg under the tongue every 5 (five) minutes as needed.        Marland Kitchen SPIRONOLACTONE PO Take by mouth. M,W,F         BP 127/82  Pulse 71  Ht 5\' 7"  (1.702 m)  Wt 144 lb (65.318 kg)  BMI 22.55 kg/m2 General:  Well developed, well nourished, in no acute distress. Neck:  Neck supple, no JVD. No masses, thyromegaly or abnormal cervical nodes. Lungs:  Clear bilaterally to auscultation and percussion. Heart:  Non-displaced PMI, chest non-tender; regular rate and rhythm, S1, S2 without murmurs, rubs. +S4. Carotid upstroke normal, no bruit.  Pedals normal pulses. 1+ edema right ankle.  Abdomen:  Bowel sounds positive; abdomen soft and non-tender without masses,  organomegaly, or hernias noted. No hepatosplenomegaly. Extremities:  No clubbing or cyanosis. Neurologic:  Alert and oriented x 3. Psych:  Normal affect.

## 2011-06-21 NOTE — Assessment & Plan Note (Signed)
Lipids reasonable when checked in 4/12.

## 2011-06-21 NOTE — Assessment & Plan Note (Signed)
Patient is not in atrial fibrillation today.  He tolerates atrial fibrillation poorly.  I will have him continue amiodarone 100 mg daily as well as diltiazem CD.   - He will remain on ASA only for anticoagulation given GI bleed on Pradaxa.  - Will check LFTs and TSH today (amiodarone use).  He follows up regularly with an eye doctor.

## 2011-06-22 ENCOUNTER — Telehealth: Payer: Self-pay | Admitting: Cardiology

## 2011-06-22 MED ORDER — BENAZEPRIL HCL 10 MG PO TABS: 10.0000 mg | ORAL_TABLET | Freq: Every day | ORAL | Status: DC

## 2011-06-22 MED ORDER — DILTIAZEM HCL 120 MG PO TABS: 120.0000 mg | ORAL_TABLET | Freq: Every day | ORAL | Status: DC

## 2011-06-22 NOTE — Telephone Encounter (Signed)
diltiazem 120mg  90 day and benazepril 10mg  90 day refill needed per pt [facility] requested by fax, pt has one day left, needs asap

## 2011-06-22 NOTE — Telephone Encounter (Deleted)
Pt calling re:

## 2011-06-26 ENCOUNTER — Encounter: Payer: Self-pay | Admitting: *Deleted

## 2011-06-29 ENCOUNTER — Telehealth: Payer: Self-pay | Admitting: Cardiology

## 2011-06-29 NOTE — Telephone Encounter (Signed)
Pharmacy aware patient is taking Cardizem CD 120 mg daily.

## 2011-06-29 NOTE — Telephone Encounter (Signed)
Pt picked up diltiazem and it was not the 24 hr that he always gets, wants to know why it was changed and would prefer the 24 hr

## 2011-06-29 NOTE — Telephone Encounter (Signed)
Pharm needs clarification on diltiazem the need to know if it is ER or not because it has always been ER and they need to know if it changed. Pharm has faxed request before but they still have not heard anything

## 2011-07-24 ENCOUNTER — Other Ambulatory Visit: Payer: Self-pay | Admitting: Urology

## 2011-07-24 DIAGNOSIS — C61 Malignant neoplasm of prostate: Secondary | ICD-10-CM

## 2011-08-06 ENCOUNTER — Ambulatory Visit (HOSPITAL_COMMUNITY): Payer: Medicare Other

## 2011-08-06 ENCOUNTER — Encounter (HOSPITAL_COMMUNITY)
Admission: RE | Admit: 2011-08-06 | Discharge: 2011-08-06 | Disposition: A | Discharge status: Home / Self Care | Payer: Medicare Other | Source: Ambulatory Visit | Attending: Urology | Admitting: Urology

## 2011-08-06 ENCOUNTER — Encounter (HOSPITAL_COMMUNITY): Payer: Self-pay

## 2011-08-06 DIAGNOSIS — C61 Malignant neoplasm of prostate: Secondary | ICD-10-CM | POA: Insufficient documentation

## 2011-08-06 MED ORDER — TECHNETIUM TC 99M MEDRONATE IV KIT
23.0000 | PACK | Freq: Once | INTRAVENOUS | Status: AC | PRN
  Administered 2011-08-06: 23 via INTRAVENOUS

## 2011-08-21 ENCOUNTER — Telehealth: Payer: Self-pay | Admitting: Cardiology

## 2011-08-21 NOTE — Telephone Encounter (Signed)
Pt needs a refill on sp[facility]olactone [facility] [address] pt has called this in before and it was sent to a Pharm in [place] and he need the approval for [place]

## 2011-08-24 NOTE — Telephone Encounter (Signed)
Pt calling back again regarding pt needing refill of spironolactone. [facility] [address]

## 2011-08-27 MED ORDER — SPIRONOLACTONE 25 MG PO TABS: ORAL_TABLET | ORAL | Status: DC

## 2011-10-07 ENCOUNTER — Encounter (HOSPITAL_COMMUNITY): Payer: Self-pay | Admitting: Emergency Medicine

## 2011-10-07 ENCOUNTER — Inpatient Hospital Stay (HOSPITAL_COMMUNITY)
Admission: EM | Admit: 2011-10-07 | Discharge: 2011-10-09 | DRG: 069 | Disposition: A | Discharge status: Nursing Home (Includes ICF) | Payer: Medicare Other | Attending: Family Medicine | Admitting: Family Medicine

## 2011-10-07 ENCOUNTER — Other Ambulatory Visit: Payer: Self-pay

## 2011-10-07 ENCOUNTER — Inpatient Hospital Stay (HOSPITAL_COMMUNITY): Payer: Medicare Other

## 2011-10-07 ENCOUNTER — Emergency Department (HOSPITAL_COMMUNITY): Payer: Medicare Other

## 2011-10-07 ENCOUNTER — Encounter: Payer: Self-pay | Admitting: Family Medicine

## 2011-10-07 DIAGNOSIS — E785 Hyperlipidemia, unspecified: Secondary | ICD-10-CM | POA: Diagnosis present

## 2011-10-07 DIAGNOSIS — I6990 Unspecified sequelae of unspecified cerebrovascular disease: Secondary | ICD-10-CM

## 2011-10-07 DIAGNOSIS — I5032 Chronic diastolic (congestive) heart failure: Secondary | ICD-10-CM | POA: Diagnosis present

## 2011-10-07 DIAGNOSIS — I509 Heart failure, unspecified: Secondary | ICD-10-CM | POA: Diagnosis present

## 2011-10-07 DIAGNOSIS — Z79899 Other long term (current) drug therapy: Secondary | ICD-10-CM

## 2011-10-07 DIAGNOSIS — Z7982 Long term (current) use of aspirin: Secondary | ICD-10-CM

## 2011-10-07 DIAGNOSIS — R569 Unspecified convulsions: Secondary | ICD-10-CM

## 2011-10-07 DIAGNOSIS — I10 Essential (primary) hypertension: Secondary | ICD-10-CM | POA: Diagnosis present

## 2011-10-07 DIAGNOSIS — H409 Unspecified glaucoma: Secondary | ICD-10-CM | POA: Insufficient documentation

## 2011-10-07 DIAGNOSIS — I4891 Unspecified atrial fibrillation: Secondary | ICD-10-CM | POA: Diagnosis present

## 2011-10-07 DIAGNOSIS — I4892 Unspecified atrial flutter: Secondary | ICD-10-CM | POA: Diagnosis present

## 2011-10-07 DIAGNOSIS — H9193 Unspecified hearing loss, bilateral: Secondary | ICD-10-CM | POA: Insufficient documentation

## 2011-10-07 DIAGNOSIS — G459 Transient cerebral ischemic attack, unspecified: Secondary | ICD-10-CM

## 2011-10-07 DIAGNOSIS — G458 Other transient cerebral ischemic attacks and related syndromes: Principal | ICD-10-CM | POA: Diagnosis present

## 2011-10-07 DIAGNOSIS — Z8546 Personal history of malignant neoplasm of prostate: Secondary | ICD-10-CM

## 2011-10-07 HISTORY — DX: Unspecified convulsions: R56.9

## 2011-10-07 LAB — HEMOGLOBIN A1C
Hgb A1c MFr Bld: 5.9 % — ABNORMAL HIGH (ref <5.7)
Mean Plasma Glucose: 123 mg/dL — ABNORMAL HIGH (ref <117)

## 2011-10-07 LAB — POCT I-STAT, CHEM 8
BUN: 19 mg/dL (ref 6–23)
Chloride: 103 mEq/L (ref 96–112)
Creatinine, Ser: 1 mg/dL (ref 0.50–1.35)
Potassium: 4.1 mEq/L (ref 3.5–5.1)
Sodium: 135 mEq/L (ref 135–145)
TCO2: 20 mmol/L (ref 0–100)

## 2011-10-07 LAB — BASIC METABOLIC PANEL
CO2: 21 mEq/L (ref 19–32)
Calcium: 9.2 mg/dL (ref 8.4–10.5)
Chloride: 100 mEq/L (ref 96–112)
Potassium: 4.1 mEq/L (ref 3.5–5.1)
Sodium: 136 mEq/L (ref 135–145)

## 2011-10-07 LAB — CREATININE, SERUM: GFR calc Af Amer: 87 mL/min — ABNORMAL LOW (ref >90)

## 2011-10-07 LAB — CBC
HCT: 42.2 % (ref 39.0–52.0)
HCT: 40 % (ref 39.0–52.0)
Hemoglobin: 14.8 g/dL (ref 13.0–17.0)
Hemoglobin: 13.8 g/dL (ref 13.0–17.0)
MCHC: 34.5 g/dL (ref 30.0–36.0)
MCV: 93.2 fL (ref 78.0–100.0)
MCV: 92.8 fL (ref 78.0–100.0)
Platelets: 164 10*3/uL (ref 150–400)
RBC: 4.53 MIL/uL (ref 4.22–5.81)
RDW: 14.1 % (ref 11.5–15.5)
WBC: 8 10*3/uL (ref 4.0–10.5)
WBC: 9.7 10*3/uL (ref 4.0–10.5)

## 2011-10-07 LAB — CARDIAC PANEL(CRET KIN+CKTOT+MB+TROPI)
CK, MB: 4.9 ng/mL — ABNORMAL HIGH (ref 0.3–4.0)
Relative Index: INVALID (ref 0.0–2.5)
Troponin I: <0.3 ng/mL (ref <0.30)

## 2011-10-07 LAB — CK TOTAL AND CKMB (NOT AT ARMC): Relative Index: INVALID (ref 0.0–2.5)

## 2011-10-07 LAB — TROPONIN I: Troponin I: <0.3 ng/mL (ref <0.30)

## 2011-10-07 MED ORDER — ASPIRIN 300 MG RE SUPP: 300.0000 mg | Freq: Every day | RECTAL | Status: DC

## 2011-10-07 MED ORDER — HEPARIN SODIUM (PORCINE) 5000 UNIT/ML IJ SOLN
5000.0000 [IU] | Freq: Three times a day (TID) | INTRAMUSCULAR | Status: DC
  Filled 2011-10-07 (×2): qty 1

## 2011-10-07 MED ORDER — ENOXAPARIN SODIUM 40 MG/0.4ML ~~LOC~~ SOLN
40.0000 mg | SUBCUTANEOUS | Status: DC
  Administered 2011-10-07 – 2011-10-08 (×2): 40 mg via SUBCUTANEOUS
  Filled 2011-10-07 (×3): qty 0.4

## 2011-10-07 MED ORDER — SPIRONOLACTONE 25 MG PO TABS
25.0000 mg | ORAL_TABLET | ORAL | Status: DC
  Administered 2011-10-08: 25 mg via ORAL
  Filled 2011-10-07 (×2): qty 1

## 2011-10-07 MED ORDER — AMIODARONE HCL 100 MG PO TABS
100.0000 mg | ORAL_TABLET | Freq: Every day | ORAL | Status: DC
  Administered 2011-10-08 – 2011-10-09 (×2): 100 mg via ORAL
  Filled 2011-10-07 (×2): qty 1

## 2011-10-07 MED ORDER — ROSUVASTATIN CALCIUM 5 MG PO TABS
5.0000 mg | ORAL_TABLET | Freq: Every day | ORAL | Status: DC
  Administered 2011-10-07 – 2011-10-08 (×2): 5 mg via ORAL
  Filled 2011-10-07 (×3): qty 1

## 2011-10-07 MED ORDER — SODIUM CHLORIDE 0.9 % IV SOLN
INTRAVENOUS | Status: DC
  Administered 2011-10-07: 20:00:00 via INTRAVENOUS

## 2011-10-07 MED ORDER — SIMVASTATIN 20 MG PO TABS: 20.0000 mg | ORAL_TABLET | Freq: Every day | ORAL | Status: DC

## 2011-10-07 MED ORDER — ACETAMINOPHEN 325 MG PO TABS: 650.0000 mg | ORAL_TABLET | ORAL | Status: DC | PRN

## 2011-10-07 MED ORDER — DILTIAZEM HCL 60 MG PO TABS
120.0000 mg | ORAL_TABLET | Freq: Every day | ORAL | Status: DC
  Administered 2011-10-07 – 2011-10-09 (×3): 120 mg via ORAL
  Filled 2011-10-07 (×3): qty 2

## 2011-10-07 MED ORDER — ASPIRIN 81 MG PO CHEW
81.0000 mg | CHEWABLE_TABLET | Freq: Every day | ORAL | Status: DC
  Administered 2011-10-08 – 2011-10-09 (×2): 81 mg via ORAL
  Filled 2011-10-07 (×2): qty 1

## 2011-10-07 MED ORDER — BENAZEPRIL HCL 10 MG PO TABS
10.0000 mg | ORAL_TABLET | Freq: Every day | ORAL | Status: DC
  Administered 2011-10-08 – 2011-10-09 (×2): 10 mg via ORAL
  Filled 2011-10-07 (×2): qty 1

## 2011-10-07 MED ORDER — DILTIAZEM HCL 50 MG/10ML IV SOLN: 120.0000 mg | Freq: Once | INTRAVENOUS | Status: DC

## 2011-10-07 MED ORDER — ENOXAPARIN SODIUM 40 MG/0.4ML ~~LOC~~ SOLN
40.0000 mg | SUBCUTANEOUS | Status: DC
  Filled 2011-10-07: qty 0.4

## 2011-10-07 MED ORDER — LEVETIRACETAM 250 MG PO TABS
250.0000 mg | ORAL_TABLET | Freq: Two times a day (BID) | ORAL | Status: DC
  Administered 2011-10-07: 250 mg via ORAL
  Filled 2011-10-07 (×3): qty 1

## 2011-10-07 MED ORDER — ASPIRIN 81 MG PO TABS: 81.0000 mg | ORAL_TABLET | Freq: Every day | ORAL | Status: DC

## 2011-10-07 NOTE — Plan of Care (Signed)
Problem: Phase I Progression Outcomes Goal: Antithrombotic given by end of Day 2 Outcome: Completed/Met Date Met:  10/07/11 Given first injection of Lovenox. Goal: Pain controlled with appropriate interventions Outcome: Progressing Patient denies pain upon admission.  Only complaint was nausea due to not having eaten.

## 2011-10-07 NOTE — Progress Notes (Signed)
Pt presented to [facility] ED via EMS. Pt is from [facility]. Per facility, pt is able to return when stable. Unit based CSW will con't to follow and address safe disposition plans/options.   Dionne Milo MSW, LCSWA [facility] Emergency Dept. Weekend/Social Worker [phone]

## 2011-10-07 NOTE — Progress Notes (Signed)
Patient arrived to floor per stretcher from emergency department.  Patient is alert and oriented; able to provide place, name and dob.  Ambulated to bathroom X 2 assist. Urinates without any difficulty.  Oriented to room, placed on telemetry and put call bell within patient reach.

## 2011-10-07 NOTE — H&P (Signed)
I interviewed and examined this patient and discussed the care plan with Dr. Tye Savoy and the FPTS team and agree with assessment and plan as documented in the admission note for today. Since he reports complete loss of consciousness, bit his left buccal surface, was initially combative, and now is normal neurologically, a seizure due to his prior cerebral infarct is most likely. MRI/MRA is being done to look for new lesions and cerebrovascular disease. Being monitored to see if he is having paroxysmal supraventricular arrhythmias.     Graycen Degan A. Sheffield Slider, MD Family Medicine Teaching Service Attending  10/07/2011 5:25 PM

## 2011-10-07 NOTE — H&P (Signed)
Family Medicine Teaching [facility] Admission History and Physical  Patient name: Henry Meyers Medical record number: [id number] Date of birth: 03-20-1920 Age: 78 y.o. Gender: male  Primary Care Provider: Tally Due, MD  Chief Complaint: TIA History of Present Illness: Henry Meyers is a 77 y.o. year old male with history of paroxsymal atrial fib, HTN, HLD presenting with a TIA.  The patient was in his usual state of health until this morning around 1000 at which time he stood up from his chair and felt dizzy.  After that, the next thing he remembers is being in the ambulance and telling EMS that he didn't need to go to the hospital.  He denies any weakness, difficulty with speech, numbness or tingling, chest pain, shortness of breath, nausea, vomiting, diaphoresis.  He states that he had a normal morning with the exception of not eating breakfast.  Just prior to this episode he had taken his dog for a walk with no difficulty.  The patient lives at [facility], an assisted living facility.  Per report, the staff noted slurred speech, followed by seizure activity and a post-ictal combative state.  EMS brought him to the ED with a code stroke.  There is no history of loss of consciousness.   In the ED, an EKG showed A flutter with a rate of 106; a CT head showed no acute processes; and neurology was consulted.    Patient Active Problem List  Diagnoses  . HYPERLIPIDEMIA  . HYPERTENSION  . VENTRICULAR TACHYCARDIA  . ATRIAL FIBRILLATION  . DIASTOLIC HEART FAILURE, CHRONIC   Past Medical History: Past Medical History  Diagnosis Date  . HTN (hypertension)   . HLD (hyperlipidemia)   . Ventricular tachycardia, non-sustained 2004  . Prostate cancer   . Diastolic CHF, chronic   . Atrial fibrillation   . Elevated troponin     Past Surgical History: Past Surgical History  Procedure Date  . Wrist surgery     Social History: History   Social History  . Marital Status:  Widowed    Spouse Name: N/A    Number of Children: 2  . Years of Education: N/A   Occupational History  . retired    Social History Main Topics  . Smoking status: Never Smoker   . Smokeless tobacco: None  . Alcohol Use: Yes     socially  . Drug Use: None  . Sexually Active: None   Other Topics Concern  . None   Social History Narrative   Lives at [facility].  Has two sons; one lives in [place] and one in [place].  Does not have any family in [place], but does have lots of friends here.    Family History: Family History  Problem Relation Age of Onset  . Parkinsonism Father   Mom with stroke  Allergies: No Known Allergies  No current facility-administered medications for this encounter.   Current Outpatient Prescriptions  Medication Sig Dispense Refill  . amiodarone (PACERONE) 200 MG tablet Take 0.5 tablets (100 mg total) by mouth daily.  30 tablet  6  . aspirin 81 MG tablet Take 81 mg by mouth daily.        Marland Kitchen atorvastatin (LIPITOR) 10 MG tablet Take 1 tablet (10 mg total) by mouth daily.  30 tablet  11  . benazepril (LOTENSIN) 10 MG tablet Take 1 tablet (10 mg total) by mouth daily.  90 tablet  3  . bimatoprost (LUMIGAN) 0.03 % ophthalmic drops as  directed.        . diltiazem (CARDIZEM) 120 MG tablet Take 1 tablet (120 mg total) by mouth daily.  90 tablet  3  . Multiple Vitamin (MULTIVITAMIN) tablet Take 1 tablet by mouth daily.        . nitroGLYCERIN (NITROSTAT) 0.4 MG SL tablet Place 0.4 mg under the tongue every 5 (five) minutes as needed.        Marland Kitchen spironolactone (ALDACTONE) 25 MG tablet 1 tab Mon,Wed, Friday  30 tablet  12   Review Of Systems: Per HPI with the following additions: none Otherwise 12 point review of systems was performed and was unremarkable.  Physical Exam: Pulse: 66  Blood Pressure: 162/91 RR: 19   O2: 96% on 2L Temp: 97.5  General: alert, cooperative, appears stated age and no distress HEENT: PERRLA, extra ocular movement intact,  sclera clear, anicteric, oropharynx clear, no lesions and neck supple with midline trachea; no carotid bruits appreciated Heart: S1, S2 normal, no murmur, rub or gallop, regular rate and rhythm Lungs: clear to auscultation, no wheezes or rales and unlabored breathing Abdomen: abdomen is soft without significant tenderness, masses, organomegaly or guarding Extremities: extremities normal, atraumatic, no cyanosis; 2+ edema to mid-shin bilaterally; multiple bruises noted on arms Skin: multiple bruising on arms Neurology: normal without focal findings, mental status, speech normal, alert and oriented x3, PERLA, cranial nerves 2-12 intact, muscle tone and strength normal and symmetric and reflexes normal and symmetric  Labs and Imaging: Lab Results  Component Value Date/Time   NA 135 10/07/2011 11:38 AM   K 4.1 10/07/2011 11:38 AM   CL 103 10/07/2011 11:38 AM   CO2 21 10/07/2011 11:36 AM   BUN 19 10/07/2011 11:38 AM   CREATININE 1.00 10/07/2011 11:38 AM   GLUCOSE 126* 10/07/2011 11:38 AM   Lab Results  Component Value Date   WBC 8.0 10/07/2011   HGB 15.3 10/07/2011   HCT 45.0 10/07/2011   MCV 93.2 10/07/2011   PLT 164 10/07/2011    Lab 10/07/11 1136  CKTOTAL 73  TROPONINI <0.30  TROPONINT --  CKMBINDEX --   CT Head:  No evidence of acute intracranial abnormality.  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  Atrophy with small vessel ischemic changes and intracranial  atherosclerosis.  Assessment and Plan: Henry Meyers is a 77 y.o. year old male presenting with TIA vs seizure  1. TIA: Had focal findings at initial presentation to ED; these had resolved at the time of our exam.  Although the patient denies any history of prior stroke, his CT showed an old left posterior frontal infarct.  He does have hypertension and atrial fib which are risk factors for stroke.  CHADS2 score of 5; which puts him in the high risk category.  Currently on aspirin only as had a GI bleed on Pradaxa.   Appreciate neurology recommendations. -MRI/MRA head -will obtain HbA1c and FLP -will hold off on echo and carotid dopplers at this time  2. Seizure: Seizure activity was noted prior to EMS arrival at [facility].  He appeared to have post-ictal state with combativeness and confusion that has resolved.  Patient does not report prior seizures. -EEG per neuro recs -start Keppra per neuro recs  3. Paroxsymal Atrial Fib: Followed by Dr. Freida Busman in cardiology.  ECG in ED showed atrial flutter; monitor during exam was NSR in the 60s.   -Diltiazem 120mg  IV -- will switch back to PO if passes swallow eval -will hold amiodarone until after swallow study -  will cycle cardiac enzymes -recheck ECG tomorrow morning  4. Diastolic Heart Failure: Followed by Dr. Freida Busman in cardiology.  Last seen in August; stable at that time.  Does not appear to be in an exacerbation. -will hold home spironalactone until after swallow study  5. Hypertension: Stable; will hold home medication until after swallow eval  6. Hyperlipidemia: Stable; will continue home medication until after swallow eval  7. Social: lives at [facility]; one of his sons will be coming down tomorrow or Tuesday  FEN/GI: NPO until bedside swallow eval; will start [facility] diet if passes; NS 100cc/hr Prophylaxis: heparin SQ 5000 units TID Disposition: pending further workup  BOOTH, ERIN 10/07/2011, 3:22 PM   PGY-2 ADDENDUM:  I have seen and examined patient with Dr. Elwyn Meyers and I agree with her assessment and plan.  Briefly, this is an 77 year old who is a resident at [facility].  He lives alone with his dog.  This morning around 9am, patient was outside walking his dog and became dizzy.  The next thing he remembers is waking up in the ambulance.  Per ED report, patient did have slurred speech on arrival, but this has resolved.  Neurology has seen patient in ED and recommended MRI/MRA head, EEG, and Keppra for seizure activity  that was witnessed Assisted Living Facility.  Of note, patient sees a cardiologist for paroxysmal atrial fibrillation - takes ASA for anticoagulation (history of GI bleed).  Currently, patient's speech is normal without any focal neurological deficits on exam.  Prior to this episode, patient was walking his dog (with cane) about six times per day.    ROS: Denies any headache, weakness, numbness/tingling of extremities.  Denies any CP, SOB, nausea/vomiting, or difficulty speaking or eating.  Please see excellent PGY-1 for PMH, SH, FH, medications, and allergies.  PHYSICAL EXAM: GENERAL: pleasant, in no acute distress NEURO: alert, awake, oriented to time, place, self.  CN 2-12 intact.  No motor or sensory deficits. HEENT: NCAT.  EOMI.  PERRLA.  MM dry.  Oropharynx without erythema or exudate. CVASC: RRR, no murmur appreciated RESP: CTAB bilaterally, poor respiratory effort, no wheezes or rales, non-labored breathing ABDO: soft, NT, ND, active bowel sounds EXT: trace pedal edema L > R, but no clubbing or cyanosis SKIN: multiple bruises on arms bilaterally, no open wounds or rashes  A/P:  77 year old male with PMH HTN, HLD, diastolic CHF, Atrial fibrillation who presents with possible TIA vs. Seizure. 1) TIA: Will admit to telemetry.  Appreciate Neurology recommendations.  CT head was negative for acute intracranial disease.  Will order MRI/MRA head/neck and EEG.  Will order Alc and FLP.  Will continue ASA.  Will order bedside swallow study before starting diet and PO meds.  Follow up Neurology recommendations in am. 2) A. Fib: Will start Diltiazem 120 IV daily for now.  Once passes swallow study, will start home medications - Amiodarone and Diltiazem PO.  Will cycle cardiac enzymes. 3) HTN: will order Diltiazem 120 mg IV daily.  Will restart home medications when passes swallow study. 4) HLD:  Will order FLP and hold statin until passes swallow study. 5) ? Seizure DO:  Per Neuro, start Keppra  BID. 6) Elevated CBGs:  No known history of DM.  Will check CBGs q 4 hrs.  Will order A1C.  7) FEN/GI: Diet pending bedside swallow study. If passes, heart healthy diet. Start NS @ 100 cc/ hr. 8) PPX: Lovenox 40 IV daily. 9) Dispo: pending further  work up per Neurology and clinical improvement.

## 2011-10-07 NOTE — ED Notes (Signed)
Pt speaking with his son Loraine Leriche at this time.  Contact info is Randol Zumstein [phone] (home) and [phone] (cell).  Pt's local friend who can make decisions: Dr. Robert Bellow [phone] (cell).  Loraine Leriche has emailed Dr. Perrin Maltese to let him know the situation with pt.

## 2011-10-07 NOTE — ED Notes (Signed)
Gave old and new ECG to Dr. Thad Ranger and Dr. Bebe Shaggy.after I performed. 12:10 pm JG and His CBG was 140 12:11 pm JG

## 2011-10-07 NOTE — Plan of Care (Signed)
Problem: Phase I Progression Outcomes Goal: Strict NPO til swallow screen done Outcome: Completed/Met Date Met:  10/07/11 Patient passed swallow screen; put on heart healthy diet.

## 2011-10-07 NOTE — ED Provider Notes (Signed)
History     CSN: [id number] Arrival date & time: 10/07/2011 11:26 AM   First MD Initiated Contact with Patient 10/07/11 1127    pt seen on arrival in ED  Chief Complaint  Patient presents with  . Altered Mental Status  . Seizures  . Aphasia     Patient is a 77 y.o. male presenting with altered mental status and seizures. The history is provided by the patient and the EMS personnel.  Altered Mental Status This is a new problem. The current episode started less than 1 hour ago. The problem occurs constantly. The problem has been gradually improving. Pertinent negatives include no chest pain. The symptoms are aggravated by nothing. The symptoms are relieved by nothing.  Seizures  Pertinent negatives include no chest pain.  pt seen on arrival, concern for CVA Pt went to walk dog, came back with garbled speech and then had seizure and bit his tongue He is now improved   Past Medical History  Diagnosis Date  . HTN (hypertension)   . HLD (hyperlipidemia)   . Ventricular tachycardia, non-sustained 2004  . Prostate cancer   . Diastolic CHF, chronic   . Atrial fibrillation   . Elevated troponin     Past Surgical History  Procedure Date  . Wrist surgery     Family History  Problem Relation Age of Onset  . Parkinsonism Father     History  Substance Use Topics  . Smoking status: Never Smoker   . Smokeless tobacco: Not on file  . Alcohol Use: Yes     socially      Review of Systems  Cardiovascular: Negative for chest pain.  Neurological: Positive for seizures.  Psychiatric/Behavioral: Positive for altered mental status.  All other systems reviewed and are negative.    Allergies  Review of patient's allergies indicates no known allergies.  Home Medications   Current Outpatient Rx  Name Route Sig Dispense Refill  . AMIODARONE HCL 200 MG PO TABS Oral Take 0.5 tablets (100 mg total) by mouth daily. 30 tablet 6  . ASPIRIN 81 MG PO TABS Oral Take 81 mg by mouth  daily.      . ATORVASTATIN CALCIUM 10 MG PO TABS Oral Take 1 tablet (10 mg total) by mouth daily. 30 tablet 11  . BENAZEPRIL HCL 10 MG PO TABS Oral Take 1 tablet (10 mg total) by mouth daily. 90 tablet 3  . BIMATOPROST 0.03 % OP SOLN  as directed.      Marland Kitchen DILTIAZEM HCL 120 MG PO TABS Oral Take 1 tablet (120 mg total) by mouth daily. 90 tablet 3  . ONE-DAILY MULTI VITAMINS PO TABS Oral Take 1 tablet by mouth daily.      Marland Kitchen NITROGLYCERIN 0.4 MG SL SUBL Sublingual Place 0.4 mg under the tongue every 5 (five) minutes as needed.      Marland Kitchen SPIRONOLACTONE 25 MG PO TABS  1 tab Mon,Wed, Friday 30 tablet 12    BP 172/100  Pulse 120  Temp(Src) 97.5 F (36.4 C) (Oral)  Resp 26  SpO2 97%  Physical Exam  CONSTITUTIONAL: Well developed/well nourished HEAD AND FACE: Normocephalic/atraumatic EYES: EOMI ENMT: Mucous membranes moist, dried blood noted to lips NECK: supple no meningeal signs SPINE:cspine nontender CV:  no murmurs/rubs/gallops noted LUNGS: Lungs are clear to auscultation bilaterally, no apparent distress ABDOMEN: soft, nontender, no rebound or guarding NEURO: Pt is awake/alert, moves all extremitiesx4 No facial droop No arm/leg drift Pt is well appearing EXTREMITIES: pulses normal, full  ROM SKIN: warm, color normal PSYCH: no abnormalities of mood noted   ED Course  Procedures   Labs Reviewed  POCT I-STAT, CHEM 8 - Abnormal; Notable for the following:    Glucose, Bld 126 (*)    Calcium, Ion 1.11 (*)    All other components within normal limits  GLUCOSE, CAPILLARY - Abnormal; Notable for the following:    Glucose-Capillary 140 (*)    All other components within normal limits  CBC  APTT  BASIC METABOLIC PANEL  CK TOTAL AND CKMB  TROPONIN I   Ct Head Wo Contrast  10/07/2011  *RADIOLOGY REPORT*  Clinical Data: Code stroke, confusion, right arm weakness, seizure- like activity  CT HEAD WITHOUT CONTRAST  Technique:  Contiguous axial images were obtained from the base of the  skull through the vertex without contrast.  Comparison: None.  Findings: No evidence of parenchymal hemorrhage or extra-axial fluid collection. No mass lesion, mass effect, or midline shift.  No CT evidence of acute infarction.  Encephalomalacic changes from prior infarct in the posterior left frontal lobe.  Subcortical white matter and periventricular small vessel ischemic changes. Intracranial atherosclerosis.  Atrophy with secondary ventricular prominence.  The visualized paranasal sinuses are essentially clear. The mastoid air cells are unopacified.  No evidence of calvarial fracture.  IMPRESSION: No evidence of acute intracranial abnormality.  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  Atrophy with small vessel ischemic changes and intracranial atherosclerosis.  Original Report Authenticated By: Charline Bills, M.D.       Pt seen on arrival at 1127am Went straight to CT scanner CT head negative D/w dr Thad Ranger, neuro, no TPA due to improving status and had related seizure 12:10 PM tPA in stroke considered but not given due to: Symptoms resolving, involved seizure and neurology decision (reynolds)  1:07 PM D/w dr Thad Ranger, recommends medicine admit Pt stable, no new complaints  D/w [facility] will admit (dr booth resident)   MDM  Nursing notes reviewed and considered in documentation All labs/vitals reviewed and considered    Date: 10/07/2011  Rate: 106  Rhythm: atrial flutter  QRS Axis: normal  Intervals: normal  ST/T Wave abnormalities: nonspecific ST changes  Conduction Disutrbances:none  Narrative Interpretation:   Old EKG Reviewed: changes noted - new onset aflutter          Joya Gaskins, MD 10/07/11 1355

## 2011-10-07 NOTE — ED Notes (Signed)
Times from Code Stroke Log:  CS encoded 1116, CS called 1120, pt arrival to ED/ EDP exam 1126, Stroke team arrival Huntley Dec, RN) 1127, pt to CT scanner 1127, phlebotomist arrival time 1127.  Pt bilateral hearing aids removed and placed in specimen cup while in CT (replaced in hears on arrival to ED room #4 for exam by Dr. Thad Ranger).  Pt combative while in CT scanner, stating "I want to go, I don't need to be here".  Pt returned to ED room #4 at 1146.  Dr. Thad Ranger states no TPA to be given at 1202.

## 2011-10-07 NOTE — ED Notes (Signed)
Per EMS, pt from [facility].  Pt went out to walk his dog at 10am and returned with garbled speech.  Pt then had seizure (no incontinence - bit tongue).  EMS encoded to ED at 1116.

## 2011-10-07 NOTE — ED Notes (Signed)
Attempted to call report to Tammy, RN on 3000.  Was told she was busy and would have to call me back.  Dr. Sheffield Slider at pt bedside.

## 2011-10-07 NOTE — Consult Note (Signed)
Referring Physician: Bebe Shaggy    Chief Complaint: Confusion, difficulty with speech  HPI: Henry Meyers is an 77 y.o. male who left to walk his dog at about 1000.  When he returned staff found his speech slurred.  EMS was called and prior to them arriving patient was noted to have a seizure.  Was noted by EMS to be confused and combative.  Brought in as a code stroke.  Although some focality initially on presentation, patient did begin to improve quickly and speech became fluent, patient became oriented.    LSN: 1000 tPA Given: No: Seizure at presentation  Past Medical History  Diagnosis Date  . HTN (hypertension)   . HLD (hyperlipidemia)   . Ventricular tachycardia, non-sustained 2004  . Prostate cancer   . Diastolic CHF, chronic   . Atrial fibrillation   . Elevated troponin     Past Surgical History  Procedure Date  . Wrist surgery     Family History  Problem Relation Age of Onset  . Parkinsonism Father    Social History:  reports that he has never smoked. He does not have any smokeless tobacco history on file. He reports that he drinks alcohol. His drug history not on file.  Allergies: No Known Allergies  Medications: I have reviewed the patient's current medications. Prior to Admission: Amiodarone, ASA, Lipitor, Lotensin, Lumigan, Cardizem, MVI, NTG, Aldactone  ROS: Unable to obtain  Physical Examination: Blood pressure 172/100, pulse 120, temperature 97.5 F (36.4 C), temperature source Oral, resp. rate 26, SpO2 97.00%.  Neurologic Examination: Mental Status: Alert. Knew how old he was but unable to tell me where he was.  Word-finding difficulties.  Unable to follow commands. Cranial Nerves: II: ?RHH vs. Right neglect, pupils equal, round, reactive to light and accommodation III,IV, VI: ptosis not present, extraocular muscles extra-ocular motions intact bilaterally V,VII: decrease in right NLF, facial light touch sensation normal bilaterally VIII: hearing  decreased bilaterally IX,X: gag reflex present XI: trapezius strength/neck flexion strength normal bilaterally XII: tongue strength normal  Motor: Right : Upper extremity   5/5    Left:     Upper extremity   5-/5  Lower extremity   5/5     Lower extremity   5-/5 Tone and bulk:normal tone throughout; no atrophy noted Sensory: Pinprick and light touch intact throughout, bilaterally Deep Tendon Reflexes: 2+ and symmetric throughout with absent AJ's bilaterally Plantars: Right: mute   Left:mute Cerebellar: Grossly normal finger-to-nose and normal heel-to-shin test   Results for orders placed during the hospital encounter of 10/07/11 (from the past 48 hour(s))  BASIC METABOLIC PANEL     Status: Abnormal   Collection Time   10/07/11 11:36 AM      Component Value Range Comment   Sodium 136  135 - 145 (mEq/L)    Potassium 4.1  3.5 - 5.1 (mEq/L)    Chloride 100  96 - 112 (mEq/L)    CO2 21  19 - 32 (mEq/L)    Glucose, Bld 120 (*) 70 - 99 (mg/dL)    BUN 18  6 - 23 (mg/dL)    Creatinine, Ser 1.61  0.50 - 1.35 (mg/dL)    Calcium 9.2  8.4 - 10.5 (mg/dL)    GFR calc non Af Amer 72 (*) >90 (mL/min)    GFR calc Af Amer 83 (*) >90 (mL/min)   CBC     Status: Normal   Collection Time   10/07/11 11:36 AM      Component Value Range  Comment   WBC 8.0  4.0 - 10.5 (K/uL)    RBC 4.53  4.22 - 5.81 (MIL/uL)    Hemoglobin 14.8  13.0 - 17.0 (g/dL)    HCT 40.9  81.1 - 91.4 (%)    MCV 93.2  78.0 - 100.0 (fL)    MCH 32.7  26.0 - 34.0 (pg)    MCHC 35.1  30.0 - 36.0 (g/dL)    RDW 78.2  95.6 - 21.3 (%)    Platelets 164  150 - 400 (K/uL)   CK TOTAL AND CKMB     Status: Abnormal   Collection Time   10/07/11 11:36 AM      Component Value Range Comment   Total CK 73  7 - 232 (U/L)    CK, MB 4.3 (*) 0.3 - 4.0 (ng/mL)    Relative Index RELATIVE INDEX IS INVALID  0.0 - 2.5    TROPONIN I     Status: Normal   Collection Time   10/07/11 11:36 AM      Component Value Range Comment   Troponin I <0.30  <0.30  (ng/mL)   APTT     Status: Normal   Collection Time   10/07/11 11:36 AM      Component Value Range Comment   aPTT 28  24 - 37 (seconds)   POCT I-STAT, CHEM 8     Status: Abnormal   Collection Time   10/07/11 11:38 AM      Component Value Range Comment   Sodium 135  135 - 145 (mEq/L)    Potassium 4.1  3.5 - 5.1 (mEq/L)    Chloride 103  96 - 112 (mEq/L)    BUN 19  6 - 23 (mg/dL)    Creatinine, Ser 0.86  0.50 - 1.35 (mg/dL)    Glucose, Bld 578 (*) 70 - 99 (mg/dL)    Calcium, Ion 4.69 (*) 1.12 - 1.32 (mmol/L)    TCO2 20  0 - 100 (mmol/L)    Hemoglobin 15.3  13.0 - 17.0 (g/dL)    HCT 62.9  52.8 - 41.3 (%)   GLUCOSE, CAPILLARY     Status: Abnormal   Collection Time   10/07/11 11:53 AM      Component Value Range Comment   Glucose-Capillary 140 (*) 70 - 99 (mg/dL)    Ct Head Wo Contrast  10/07/2011  *RADIOLOGY REPORT*  Clinical Data: Code stroke, confusion, right arm weakness, seizure- like activity  CT HEAD WITHOUT CONTRAST  Technique:  Contiguous axial images were obtained from the base of the skull through the vertex without contrast.  Comparison: None.  Findings: No evidence of parenchymal hemorrhage or extra-axial fluid collection. No mass lesion, mass effect, or midline shift.  No CT evidence of acute infarction.  Encephalomalacic changes from prior infarct in the posterior left frontal lobe.  Subcortical white matter and periventricular small vessel ischemic changes. Intracranial atherosclerosis.  Atrophy with secondary ventricular prominence.  The visualized paranasal sinuses are essentially clear. The mastoid air cells are unopacified.  No evidence of calvarial fracture.  IMPRESSION: No evidence of acute intracranial abnormality.  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  Atrophy with small vessel ischemic changes and intracranial atherosclerosis.  Original Report Authenticated By: Charline Bills, M.D.    Assessment: 77 y.o. male with history of afib on no anticoagulation who presents  with mental status change and slurred speech .  Was noted to have a seizure at the facility and was likely post-ictal on presentation but is returning  to baseline at this time.  Had a similar event in February of this year.  CT shows no evidence of an acute event.  Stroke Risk Factors - atrial fibrillation, hyperlipidemia and hypertension  Plan: 1. MRI of the brain without contrast with stroke work up to be initiated if acute event noted. 2. Patient with afib.  May wish to get cardiology involved. 3. EEG 4. Keppra 250mg  bid-patient with old infarct on imaging that may very well be functioning as a focus for seizure activity. Based on history this is likely not his first event.    Thana Farr, MD [facility] [phone] 10/07/2011, 12:34 PM

## 2011-10-08 ENCOUNTER — Ambulatory Visit (HOSPITAL_COMMUNITY): Payer: Medicare Other

## 2011-10-08 LAB — GLUCOSE, CAPILLARY
Glucose-Capillary: 128 mg/dL — ABNORMAL HIGH (ref 70–99)
Glucose-Capillary: 105 mg/dL — ABNORMAL HIGH (ref 70–99)

## 2011-10-08 LAB — LIPID PANEL
HDL: 59 mg/dL (ref >39)
Triglycerides: 77 mg/dL (ref <150)

## 2011-10-08 LAB — CARDIAC PANEL(CRET KIN+CKTOT+MB+TROPI): Troponin I: <0.3 ng/mL (ref <0.30)

## 2011-10-08 MED ORDER — LEVETIRACETAM 500 MG PO TABS
500.0000 mg | ORAL_TABLET | Freq: Two times a day (BID) | ORAL | Status: DC
Start: 2011-10-08 — End: 2011-10-09
  Administered 2011-10-08 – 2011-10-09 (×3): 500 mg via ORAL
  Filled 2011-10-08 (×4): qty 1

## 2011-10-08 NOTE — Progress Notes (Signed)
Subjective: Patient awake and alert and without complaints today.  Remembers events yesterday prior to seizure but does not remember much else.  Feels back to baseline today.  No further seizures noted.  Tolerating Keppra without noted side effects.  Objective: Vital signs in last 24 hours: Temp:  [97.5 F (36.4 C)-98.2 F (36.8 C)] 97.8 F (36.6 C) (11/19 0600) Pulse Rate:  [50-120] 50  (11/19 0600) Resp:  [16-27] 16  (11/19 0600) BP: (92-185)/(48-100) 112/59 mmHg (11/19 0600) SpO2:  [94 %-100 %] 96 % (11/19 0600) FiO2 (%):  [99 %] 99 % (11/18 1230) Weight:  [55.838 kg (123 lb 1.6 oz)] 123 lb 1.6 oz (55.838 kg) (11/18 1650)  Intake/Output from previous day: 11/18 0701 - 11/19 0700 In: 240 [P.O.:240] Out: 750 [Urine:750] Intake/Output this shift:   Nutritional status: Cardiac  Neurologic Exam: Mental Status: Alert, oriented, thought content appropriate.  Speech fluent without evidence of aphasia.  Able to follow 3 step commands without difficulty. Cranial Nerves: II: visual fields grossly normal, pupils equal, round, reactive to light and accommodation III,IV, VI: ptosis not present, extraocular muscles extra-ocular motions intact bilaterally V,VII: smile symmetric, facial light touch sensation normal bilaterally VIII: hearing normal bilaterally IX,X: gag reflex present XI: trapezius strength/neck flexion strength normal bilaterally XII: tongue strength normal  Motor: Right : Upper extremity   5/5    Left:     Upper extremity   5/5  Lower extremity   5/5     Lower extremity   5/5 Tone and bulk:normal tone throughout; no atrophy noted Sensory: Pinprick and light touch intact throughout, bilaterally Deep Tendon Reflexes: 2+ and symmetric with absent AJ's bilaterally Plantars: Right: mute   Left: mute Cerebellar: normal finger-to-nose and normal heel-to-shin test  Lab Results:  Basename 10/07/11 1727 10/07/11 1138 10/07/11 1136  WBC 9.7 -- 8.0  HGB 13.8 15.3 --  HCT  40.0 45.0 --  PLT 150 -- 164  NA -- 135 136  K -- 4.1 4.1  CL -- 103 100  CO2 -- -- 21  GLUCOSE -- 126* 120*  BUN -- 19 18  CREATININE 0.86 1.00 --  CALCIUM -- -- 9.2  LABA1C -- -- --   Lipid Panel  Basename 10/08/11 0115  CHOL 179  TRIG 77  HDL 59  CHOLHDL 3.0  VLDL 15  LDLCALC 914*    Studies/Results: Ct Head Wo Contrast  10/07/2011  *RADIOLOGY REPORT*  Clinical Data: Code stroke, confusion, right arm weakness, seizure- like activity  CT HEAD WITHOUT CONTRAST  Technique:  Contiguous axial images were obtained from the base of the skull through the vertex without contrast.  Comparison: None.  Findings: No evidence of parenchymal hemorrhage or extra-axial fluid collection. No mass lesion, mass effect, or midline shift.  No CT evidence of acute infarction.  Encephalomalacic changes from prior infarct in the posterior left frontal lobe.  Subcortical white matter and periventricular small vessel ischemic changes. Intracranial atherosclerosis.  Atrophy with secondary ventricular prominence.  The visualized paranasal sinuses are essentially clear. The mastoid air cells are unopacified.  No evidence of calvarial fracture.  IMPRESSION: No evidence of acute intracranial abnormality.  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  Atrophy with small vessel ischemic changes and intracranial atherosclerosis.  Original Report Authenticated By: Charline Bills, M.D.   Mri Brain Without Contrast  10/08/2011  *RADIOLOGY REPORT*  Clinical Data:  Sudden onset of dizziness which has resolved.  High blood pressure.  Atrial fibrillation.  MRI HEAD WITHOUT CONTRAST MRA HEAD WITHOUT CONTRAST  Technique: Multiplanar, multiecho pulse sequences of the brain and surrounding structures were obtained according to standard protocol without intravenous contrast.  Angiographic images of the head were obtained using MRA technique without contrast.  Comparison: 10/07/2011 CT.  MRI HEAD  Findings:  No acute infarct.  Left  parietal lobe area of altered signal intensity most likely related to remote infarct rather than vasogenic edema.  If the patient had a known primary malignancy and progressive symptoms referable to this area, this can be reevaluated with contrast enhanced imaging to exclude the much less likely consideration of underlying lesion at this level.  Prominent small vessel disease type changes.  Global atrophy.  Ventricular prominence felt related to atrophy rather hydrocephalus.  No intracranial mass lesion detected on this unenhanced exam.  Small area of hemorrhagic breakdown products left occipital lobe probably related to prior hemorrhagic infarct.  No other areas of intracranial hemorrhage.  Partially empty sella incidentally noted.  Major intracranial vascular structures are patent.  IMPRESSION: No acute infarct.  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  Prominent small vessel disease type changes.  Global atrophy without hydrocephalus.  MRA HEAD  Findings: Anterior circulation without medium or large size vessel significant stenosis or occlusion.  Mild branch vessel irregularity.  Ectatic vertebral arteries and basilar artery.  Right vertebral artery is dominant.  Mild irregularity with areas of mild narrowing involving portions of the vertebral arteries and basilar artery without high-grade focal stenosis.  Nonvisualization AICAs.  Branch vessel irregularity.  Ectatic ophthalmic arteries without aneurysm noted.  IMPRESSION: Mild intracranial atherosclerotic type changes as noted above.  Original Report Authenticated By: Fuller Canada, M.D.   Mr Mra Head/brain Wo Cm  10/08/2011  *RADIOLOGY REPORT*  Clinical Data:  Sudden onset of dizziness which has resolved.  High blood pressure.  Atrial fibrillation.  MRI HEAD WITHOUT CONTRAST MRA HEAD WITHOUT CONTRAST  Technique: Multiplanar, multiecho pulse sequences of the brain and surrounding structures were obtained according to  standard protocol without intravenous contrast.  Angiographic images of the head were obtained using MRA technique without contrast.  Comparison: 10/07/2011 CT.  MRI HEAD  Findings:  No acute infarct.  Left parietal lobe area of altered signal intensity most likely related to remote infarct rather than vasogenic edema.  If the patient had a known primary malignancy and progressive symptoms referable to this area, this can be reevaluated with contrast enhanced imaging to exclude the much less likely consideration of underlying lesion at this level.  Prominent small vessel disease type changes.  Global atrophy.  Ventricular prominence felt related to atrophy rather hydrocephalus.  No intracranial mass lesion detected on this unenhanced exam.  Small area of hemorrhagic breakdown products left occipital lobe probably related to prior hemorrhagic infarct.  No other areas of intracranial hemorrhage.  Partially empty sella incidentally noted.  Major intracranial vascular structures are patent.  IMPRESSION: No acute infarct.  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  Prominent small vessel disease type changes.  Global atrophy without hydrocephalus.  MRA HEAD  Findings: Anterior circulation without medium or large size vessel significant stenosis or occlusion.  Mild branch vessel irregularity.  Ectatic vertebral arteries and basilar artery.  Right vertebral artery is dominant.  Mild irregularity with areas of mild narrowing involving portions of the vertebral arteries and basilar artery without high-grade focal stenosis.  Nonvisualization AICAs.  Branch vessel irregularity.  Ectatic ophthalmic arteries without aneurysm noted.  IMPRESSION: Mild intracranial atherosclerotic  type changes as noted above.  Original Report Authenticated By: Fuller Canada, M.D.    Medications:  Scheduled:   . amiodarone  100 mg Oral Daily  . aspirin  81 mg Oral Daily  . benazepril  10 mg Oral Daily  .  diltiazem  120 mg Oral Daily  . enoxaparin (LOVENOX) injection  40 mg Subcutaneous Q24H  . levETIRAcetam  250 mg Oral BID  . rosuvastatin  5 mg Oral q1800  . spironolactone  25 mg Oral 3 times weekly  . DISCONTD: aspirin  300 mg Rectal Daily  . DISCONTD: aspirin  81 mg Oral Daily  . DISCONTD: diltiazem  120 mg Intravenous Once  . DISCONTD: enoxaparin (LOVENOX) injection  40 mg Subcutaneous Q24H  . DISCONTD: heparin  5,000 Units Subcutaneous Q8H  . DISCONTD: simvastatin  20 mg Oral Daily    Assessment/Plan:  Patient Active Hospital Problem List:  Seizure   Assessment: This is likely the second event for the patient.  At baseline today.  MRI shows no acute lesions    but there are old strokes that may provide an etiology for the seizures, particularly considering     the exam at presentation   Plan: 1. Increase Keppra to 500mg  bid            2. EEG to be performed today                                  3. Anticipate patient will be ready for discharge tomorrow from a neurological standpoint    LOS: 1 day   Thana Farr, MD [facility] [phone] 10/08/2011  8:51 AM

## 2011-10-08 NOTE — Plan of Care (Signed)
Problem: Phase I Progression Outcomes Goal: Stroke Team notified of admission Outcome: Completed/Met Date Met:  10/08/11 Stroke team aware of admission and following

## 2011-10-08 NOTE — Progress Notes (Signed)
[facility] Daily Intern Progress Note  Subjective: Doing well this morning.  Back to baseline.  Reported that neurology told him he would be going home tomorrow; his son will be here in the morning.  I have reviewed the patient's medications.  Objective Temp:  [97.5 F (36.4 C)-98.2 F (36.8 C)] 97.8 F (36.6 C) (11/19 0956) Pulse Rate:  [50-120] 64  (11/19 0956) Resp:  [16-27] 18  (11/19 0956) BP: (92-185)/(48-100) 133/71 mmHg (11/19 0956) SpO2:  [94 %-100 %] 96 % (11/19 0956) FiO2 (%):  [99 %] 99 % (11/18 1230) Weight:  [123 lb 1.6 oz (55.838 kg)] 123 lb 1.6 oz (55.838 kg) (11/18 1650)   Intake/Output Summary (Last 24 hours) at 10/08/11 1022 Last data filed at 10/08/11 0700  Gross per 24 hour  Intake    240 ml  Output    750 ml  Net   -510 ml    CBG (last 3)   Basename 10/08/11 0641 10/07/11 2231 10/07/11 1153  GLUCAP 90 174* 140*    General: alert, awake, talking on phone, NAD HEENT: PERRL, sclera white CV: RRR, no murmurs Pulm: CTAB Ext: trace pedal edema Neuro: alert, oriented, PERRL  Labs and Imaging  Lab 10/07/11 1727 10/07/11 1138 10/07/11 1136  WBC 9.7 -- 8.0  HGB 13.8 15.3 14.8  HCT 40.0 45.0 42.2  PLT 150 -- 164     Lab 10/07/11 1727 10/07/11 1138 10/07/11 1136  NA -- 135 136  K -- 4.1 4.1  CL -- 103 100  CO2 -- -- 21  BUN -- 19 18  CREATININE 0.86 1.00 0.94  LABGLOM -- -- --  GLUCOSE -- 126* --  CALCIUM -- -- 9.2   Lipid Panel     Component Value Date/Time   CHOL 179 10/08/2011 0115   TRIG 77 10/08/2011 0115   HDL 59 10/08/2011 0115   CHOLHDL 3.0 10/08/2011 0115   VLDL 15 10/08/2011 0115   LDLCALC 105* 10/08/2011 0115    Lab 10/08/11 0106 10/07/11 1726 10/07/11 1136  CKTOTAL 107 98 73  TROPONINI <0.30 <0.30 <0.30  TROPONINT -- -- --  CKMBINDEX -- -- --   HbA1c: 5.9  MRI/MRA: No acute infarct.  Remote left parietal lobe and left occipital lobe infarcts as noted  above.  Prominent small vessel disease type changes.  Global  atrophy without hydrocephalus.  Mild intracranial atherosclerotic type changes as noted above.  Assessment and Plan Henry Meyers is a 77 y.o. year old male presenting with TIA vs seizure   1. TIA: Had focal findings at initial presentation to ED; these had resolved at the time of our exam. Although the patient denies any history of prior stroke, his CT showed an old left posterior frontal infarct. He does have hypertension and atrial fib which are risk factors for stroke. CHADS2 score of 5; which puts him in the high risk category. Currently on aspirin only as had a GI bleed on Pradaxa.  MRI/MRA show no acute processes.  HbA1c and FLP are okay.  Passed a bedside swallow evaluation in the ED.  -appreciate neurology recommendations -continue daily aspirin   2. Seizure: Seizure activity was noted prior to EMS arrival at [facility]. He appeared to have post-ictal state with combativeness and confusion that has resolved. Patient does not report prior seizures. Keppra started on admission. -appreciate neurology recommendations -f/u EEG  3. Paroxsymal Atrial Fib: Followed by Dr. Freida Busman in cardiology. ECG in ED showed atrial flutter; monitor during exam was NSR  in the 60s. Restarted on his home medications of diltiazem and amiodarone.  Troponin negative x3.  CK-MB is mildly elevated to 5.0.   -f/u ECG 11/19  4. Diastolic Heart Failure: Followed by Dr. Freida Busman in cardiology. Last seen in August; stable at that time. Does not appear to be in an exacerbation. Will continue home medication of spironalactone.   5. Hypertension: Stable; will continue home medications.   6. Hyperlipidemia: Stable; will continue home medication.   7. Social: lives at [facility]; one of his sons will be coming down tomorrow or Tuesday   FEN/GI: HH diet; saline lock IV  Prophylaxis: lovenox 40mg  daily  Disposition: pending further workup;; discharge likely 11/20   BOOTH, Denny Peon Pager: [phone] 10/08/2011, 10:22  AM

## 2011-10-08 NOTE — Progress Notes (Signed)
I have seen and examined this patient. I have discussed with Dr Elwyn Reach.  I agree with their findings and plans as documented in their progress note for today.  Seizure Disorder:     Awaiting EEG.  Patient's Keppra is being titrated upwards.      He is excited about seeing his son tomorrow.  Henry Meyers D

## 2011-10-09 ENCOUNTER — Inpatient Hospital Stay (HOSPITAL_COMMUNITY): Payer: Medicare Other

## 2011-10-09 LAB — GLUCOSE, CAPILLARY: Glucose-Capillary: 90 mg/dL (ref 70–99)

## 2011-10-09 MED ORDER — LEVETIRACETAM 500 MG PO TABS: 500.0000 mg | ORAL_TABLET | Freq: Two times a day (BID) | ORAL | Status: DC

## 2011-10-09 NOTE — Procedures (Signed)
REFERRING PHYSICIAN:  Despina Hick, MD  HISTORY:  An 77 year old male admitted with seizure.  MEDICATIONS:  Amiodarone, Lotensin, aspirin, Cardizem, Lovenox, Keppra, Crestor and Aldactone.  CONDITIONS OF RECORDING:  This is a 16-channel EEG carried out with the patient in the awake state.  DESCRIPTION:  The waking background activity consists of a low-voltage, symmetrical, fairly well-organized 8 Hz alpha activity seen from the parieto-occipital and posterotemporal regions.  Low-voltage, fast activity, poorly organized was seen and during at times superimposed on more posterior rhythms.  A mixture of theta and alpha rhythm was seen from the central and temporal regions.  The patient does not drowse or sleep.  Hypoventilation was not performed.  Intermittent photic stimulation failed to elicit any change in the tracing.  IMPRESSION:  This is a normal awake EEG.  COMMENT:  An EEG with the patient is sleep deprived to elicit drowse and light sleep maybe desirable to further elicit a possible seizure disorder.          ______________________________ Thana Farr, MD    [id number] D:  10/09/2011 17:09:38  T:  10/09/2011 21:35:42  Job #:  [id number]

## 2011-10-09 NOTE — Discharge Summary (Signed)
I have seen and examined this patient. I have discussed with Dr Booth.  I agree with their findings and plans as documented in their discharge note for today.  

## 2011-10-09 NOTE — Progress Notes (Signed)
Subjective: Patient continues to do well.  Has not had any further seizure activity or episodes of altered mental status/slurred speech.  On Keppra 500mg  bid and tolerating well.  EEG reviewed and a normal awake study.  MRI reviewed and shows small vessel ischemic changes but no acute changes.  Objective: Vital signs in last 24 hours: Temp:  [97.8 F (36.6 C)-98.8 F (37.1 C)] 98 F (36.7 C) (11/20 0600) Pulse Rate:  [55-64] 64  (11/20 0600) Resp:  [16-20] 20  (11/20 0600) BP: (109-168)/(64-81) 168/81 mmHg (11/20 0946) SpO2:  [92 %-97 %] 97 % (11/20 0600)  Intake/Output from previous day: 11/19 0701 - 11/20 0700 In: 2275 [I.V.:2275] Out: 350 [Urine:350] Intake/Output this shift:   Nutritional status: Cardiac  Neurologic Exam: Mental Status:  Alert, oriented, thought content appropriate. Speech fluent without evidence of aphasia. Able to follow 3 step commands without difficulty.  Cranial Nerves:  II: visual fields grossly normal, pupils equal, round, reactive to light and accommodation  III,IV, VI: ptosis not present, extraocular muscles extra-ocular motions intact bilaterally  V,VII: smile symmetric, facial light touch sensation normal bilaterally  VIII: hearing normal bilaterally  IX,X: gag reflex present  XI: trapezius strength/neck flexion strength normal bilaterally  XII: tongue strength normal  Motor:  Right : Upper extremity 5/5 Left: Upper extremity 5/5  Lower extremity 5/5 Lower extremity 5/5  Tone and bulk:normal tone throughout; no atrophy noted  Sensory: Pinprick and light touch intact throughout, bilaterally  Deep Tendon Reflexes: 2+ and symmetric with absent AJ's bilaterally  Plantars:  Right: mute   Left: mute  Cerebellar:  normal finger-to-nose and normal heel-to-shin test   Lab Results:  Basename 10/07/11 1727 10/07/11 1138 10/07/11 1136  WBC 9.7 -- 8.0  HGB 13.8 15.3 --  HCT 40.0 45.0 --  PLT 150 -- 164  NA -- 135 136  K -- 4.1 4.1  CL -- 103 100   CO2 -- -- 21  GLUCOSE -- 126* 120*  BUN -- 19 18  CREATININE 0.86 1.00 --  CALCIUM -- -- 9.2  LABA1C -- -- --   Lipid Panel  Basename 10/08/11 0115  CHOL 179  TRIG 77  HDL 59  CHOLHDL 3.0  VLDL 15  LDLCALC 161*    Studies/Results: Ct Head Wo Contrast  10/07/2011  *RADIOLOGY REPORT*  Clinical Data: Code stroke, confusion, right arm weakness, seizure- like activity  CT HEAD WITHOUT CONTRAST  Technique:  Contiguous axial images were obtained from the base of the skull through the vertex without contrast.  Comparison: None.  Findings: No evidence of parenchymal hemorrhage or extra-axial fluid collection. No mass lesion, mass effect, or midline shift.  No CT evidence of acute infarction.  Encephalomalacic changes from prior infarct in the posterior left frontal lobe.  Subcortical white matter and periventricular small vessel ischemic changes. Intracranial atherosclerosis.  Atrophy with secondary ventricular prominence.  The visualized paranasal sinuses are essentially clear. The mastoid air cells are unopacified.  No evidence of calvarial fracture.  IMPRESSION: No evidence of acute intracranial abnormality.  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  Atrophy with small vessel ischemic changes and intracranial atherosclerosis.  Original Report Authenticated By: Charline Bills, M.D.   Mri Brain Without Contrast  10/08/2011  *RADIOLOGY REPORT*  Clinical Data:  Sudden onset of dizziness which has resolved.  High blood pressure.  Atrial fibrillation.  MRI HEAD WITHOUT CONTRAST MRA HEAD WITHOUT CONTRAST  Technique: Multiplanar, multiecho pulse sequences of the brain and surrounding structures were obtained according to standard protocol  without intravenous contrast.  Angiographic images of the head were obtained using MRA technique without contrast.  Comparison: 10/07/2011 CT.  MRI HEAD  Findings:  No acute infarct.  Left parietal lobe area of altered signal intensity most likely related to remote  infarct rather than vasogenic edema.  If the patient had a known primary malignancy and progressive symptoms referable to this area, this can be reevaluated with contrast enhanced imaging to exclude the much less likely consideration of underlying lesion at this level.  Prominent small vessel disease type changes.  Global atrophy.  Ventricular prominence felt related to atrophy rather hydrocephalus.  No intracranial mass lesion detected on this unenhanced exam.  Small area of hemorrhagic breakdown products left occipital lobe probably related to prior hemorrhagic infarct.  No other areas of intracranial hemorrhage.  Partially empty sella incidentally noted.  Major intracranial vascular structures are patent.  IMPRESSION: No acute infarct.  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  Prominent small vessel disease type changes.  Global atrophy without hydrocephalus.  MRA HEAD  Findings: Anterior circulation without medium or large size vessel significant stenosis or occlusion.  Mild branch vessel irregularity.  Ectatic vertebral arteries and basilar artery.  Right vertebral artery is dominant.  Mild irregularity with areas of mild narrowing involving portions of the vertebral arteries and basilar artery without high-grade focal stenosis.  Nonvisualization AICAs.  Branch vessel irregularity.  Ectatic ophthalmic arteries without aneurysm noted.  IMPRESSION: Mild intracranial atherosclerotic type changes as noted above.  Original Report Authenticated By: Fuller Canada, M.D.   Mr Mra Head/brain Wo Cm  10/08/2011  *RADIOLOGY REPORT*  Clinical Data:  Sudden onset of dizziness which has resolved.  High blood pressure.  Atrial fibrillation.  MRI HEAD WITHOUT CONTRAST MRA HEAD WITHOUT CONTRAST  Technique: Multiplanar, multiecho pulse sequences of the brain and surrounding structures were obtained according to standard protocol without intravenous contrast.  Angiographic images of the head  were obtained using MRA technique without contrast.  Comparison: 10/07/2011 CT.  MRI HEAD  Findings:  No acute infarct.  Left parietal lobe area of altered signal intensity most likely related to remote infarct rather than vasogenic edema.  If the patient had a known primary malignancy and progressive symptoms referable to this area, this can be reevaluated with contrast enhanced imaging to exclude the much less likely consideration of underlying lesion at this level.  Prominent small vessel disease type changes.  Global atrophy.  Ventricular prominence felt related to atrophy rather hydrocephalus.  No intracranial mass lesion detected on this unenhanced exam.  Small area of hemorrhagic breakdown products left occipital lobe probably related to prior hemorrhagic infarct.  No other areas of intracranial hemorrhage.  Partially empty sella incidentally noted.  Major intracranial vascular structures are patent.  IMPRESSION: No acute infarct.  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  Prominent small vessel disease type changes.  Global atrophy without hydrocephalus.  MRA HEAD  Findings: Anterior circulation without medium or large size vessel significant stenosis or occlusion.  Mild branch vessel irregularity.  Ectatic vertebral arteries and basilar artery.  Right vertebral artery is dominant.  Mild irregularity with areas of mild narrowing involving portions of the vertebral arteries and basilar artery without high-grade focal stenosis.  Nonvisualization AICAs.  Branch vessel irregularity.  Ectatic ophthalmic arteries without aneurysm noted.  IMPRESSION: Mild intracranial atherosclerotic type changes as noted above.  Original Report Authenticated By: Fuller Canada, M.D.  Medications:  I have reviewed the patient's current medications. Scheduled:   . amiodarone  100 mg Oral Daily  . aspirin  81 mg Oral Daily  . benazepril  10 mg Oral Daily  . diltiazem  120 mg Oral Daily    . enoxaparin (LOVENOX) injection  40 mg Subcutaneous Q24H  . levETIRAcetam  500 mg Oral BID  . rosuvastatin  5 mg Oral q1800  . spironolactone  25 mg Oral 3 times weekly    Assessment/Plan:  Patient Active Hospital Problem List: Seizure  Assessment: Patient seizure free since admission.  Tolerating Keppra.  Plan: 1. Continue Keppra at current dose upon discharge.              2. Follow up with Neurology at discharge.  Will need to be seen within a month of discharge.                       3. Patient stable from a neurological standpoint for discharge.   LOS: 2 days   Thana Farr, MD [facility] [phone] 10/09/2011  9:50 AM

## 2011-10-09 NOTE — Progress Notes (Signed)
Pt's assessment unchanged.  Discharge instructions given to pt and son, they verbalized understanding.  All pt's personal belongings taken with pt. A. Shena Vinluan, RN

## 2011-10-09 NOTE — Discharge Summary (Signed)
Discharge Summary 10/09/2011 12:57 PM  Henry Meyers DOB: 18-Aug-1920 MRN: [id number]  Date of Admission: 10/07/2011 Date of Discharge: 10/09/2011  PCP: Tally Due, MD Consultants: Dr. Thad Ranger in neurology  Reason for Admission: seizure, TIA  Discharge Diagnosis Primary 1. Seizure 2. TIA Secondary 1. Paroxsymal Atrial Fib/flutter  2. Diastolic Heart Failure 3. Hypertension  Hospital Course: Henry Meyers is a 77 y.o. year old male presenting with TIA vs seizure   1. TIA: He had focal findings at initial presentation to ED; these resolved quickly.  Although the patient denies any history of prior stroke, his CT showed an old left posterior frontal infarct. He does have hypertension and atrial fib which are risk factors for stroke. He has a CHADS2 score of 5; which puts him in the high risk category. Currently on aspirin only as had a GI bleed on Pradaxa. MRI/MRA were obtained and showed no acute processes. Risk stratification labs were obtained and were with normal limits. He passed a bedside swallow evaluation in the ED and was continued on a heart healthy diet and his home medications.   2. Seizure: Seizure activity was noted prior to EMS arrival at [facility]. He appeared to have post-ictal state with combativeness and confusion that has resolved. Neurology was consulted and obtained history that this may not be his first event. An EEG was obtained with showed a normal awake pattern and he was started on Keppra for seizure prevention given possible foci with previous infarcts.    3. Paroxsymal Atrial Fib: This is followed by Dr. Freida Busman in cardiology. ECG in ED showed atrial flutter; however he spontaneously returned to NSR.  He was monitored on telemetry and has remain in NSR during this hospitalization. He was continued on his home medications of diltiazem and amiodarone.  Given his age and co-morbidities he was evaluated for possible NSTEMI with serial troponins - these  were negative.   4. Diastolic Heart Failure: This is followed by Dr. Freida Busman in cardiology. Last seen in August and stable at that time. Last echo was in 2008 and showed EF 65% and diastolic dysfunction.  His fluid status was monitored and he remained euvolemic.  His home spironalactone was continued.    5. Hypertension: Blood pressure was initially elevated to the 180s systolic.  This decreased rapidly with a nitro drip.  The nitro drip was stopped and he was continued on his home medications.  His blood pressures remained well controlled for the rest of the admission.   Discharge Exam Temp:  [98 F (36.7 C)-98.8 F (37.1 C)] 98 F (36.7 C) (11/20 0600) Pulse Rate:  [50-64] 50  (11/20 0946) Resp:  [16-20] 20  (11/20 0946) BP: (109-168)/(64-81) 168/81 mmHg (11/20 0946) SpO2:  [92 %-97 %] 97 % (11/20 0946) General: awake, alert, NAD HEENT: sclera white, MMM CV: RRR, no murmurs Pulm: CTAB Abd: +BS, soft, non-tender, non-distended Ext: no edema Neuro: PERRL, reflexes symmetric  Procedures: EEG - normal awake pattern  Discharge Medications Medication List  As of 10/09/2011 12:57 PM   START taking these medications         levETIRAcetam 500 MG tablet   Commonly known as: KEPPRA   Take 1 tablet (500 mg total) by mouth 2 (two) times daily.         CONTINUE taking these medications         amiodarone 200 MG tablet   Commonly known as: PACERONE   Take 0.5 tablets (100 mg total) by mouth daily.  aspirin 81 MG tablet      atorvastatin 10 MG tablet   Commonly known as: LIPITOR   Take 1 tablet (10 mg total) by mouth daily.      benazepril 10 MG tablet   Commonly known as: LOTENSIN   Take 1 tablet (10 mg total) by mouth daily.      bimatoprost 0.03 % ophthalmic solution   Commonly known as: LUMIGAN      diltiazem 120 MG tablet   Commonly known as: CARDIZEM   Take 1 tablet (120 mg total) by mouth daily.      multivitamin tablet      nitroGLYCERIN 0.4 MG SL tablet    Commonly known as: NITROSTAT      spironolactone 25 MG tablet   Commonly known as: ALDACTONE   1 tab Mon,Wed, Friday          Where to get your medications    These are the prescriptions that you need to pick up. We sent them to a specific pharmacy, so you will need to go there to get them.   [id number] [place], [place] - [address]    [address] [place] [address]    Phone: [phone]        levETIRAcetam 500 MG tablet            Pertinent Hospital Labs  Lab 10/08/11 0106 10/07/11 1726 10/07/11 1136  CKTOTAL 107 98 73  TROPONINI <0.30 <0.30 <0.30  TROPONINT -- -- --  CKMBINDEX -- -- --   Lipid Panel     Component Value Date/Time   CHOL 179 10/08/2011 0115   TRIG 77 10/08/2011 0115   HDL 59 10/08/2011 0115   CHOLHDL 3.0 10/08/2011 0115   VLDL 15 10/08/2011 0115   LDLCALC 105* 10/08/2011 0115   HbA1c: 5.9  MRI/MRA:  No acute infarct.  Remote left parietal lobe and left occipital lobe infarcts as noted  above.  Prominent small vessel disease type changes.  Global atrophy without hydrocephalus.  Mild intracranial atherosclerotic type changes as noted above.  Discharge instructions: see AVS  Condition at discharge: stable  Disposition: home with neurology, cardiology, and PCP follow up  Pending Tests: none  Follow up: Follow-up Information    Follow up with GUEST,CHRIS WARREN. Call today. (for hospital follow in 1-2 weeks)    Contact information:   [facility] [address] [place] [address] [phone]       Follow up with Marca Ancona, MD. (as scheduled)    Contact information:   [address] [place] [address] [phone]       Follow up with Pola Corn, MD. Call in 1 week. (if they have not contanted you with a follow up appointment)    Contact information:   [address] [facility] [place] [address] [phone]          Follow up Issues:  1. Assess for any further seizure activity 2. Please make sure he has appointment with neurology  BOOTH, Roylee Chaffin 10/09/2011, 12:57 PM

## 2011-10-18 ENCOUNTER — Encounter: Payer: Self-pay | Admitting: Neurology

## 2011-10-18 NOTE — Telephone Encounter (Signed)
Please close encounter

## 2011-10-24 ENCOUNTER — Ambulatory Visit (INDEPENDENT_AMBULATORY_CARE_PROVIDER_SITE_OTHER): Payer: Medicare Other | Admitting: Cardiology

## 2011-10-24 ENCOUNTER — Encounter: Payer: Self-pay | Admitting: Cardiology

## 2011-10-24 DIAGNOSIS — I509 Heart failure, unspecified: Secondary | ICD-10-CM

## 2011-10-24 DIAGNOSIS — I4891 Unspecified atrial fibrillation: Secondary | ICD-10-CM

## 2011-10-24 DIAGNOSIS — I5032 Chronic diastolic (congestive) heart failure: Secondary | ICD-10-CM

## 2011-10-24 DIAGNOSIS — E785 Hyperlipidemia, unspecified: Secondary | ICD-10-CM

## 2011-10-24 LAB — HEPATIC FUNCTION PANEL
Albumin: 3.5 g/dL (ref 3.5–5.2)
Alkaline Phosphatase: 67 U/L (ref 39–117)
Total Protein: 5.9 g/dL — ABNORMAL LOW (ref 6.0–8.3)

## 2011-10-24 MED ORDER — ATORVASTATIN CALCIUM 10 MG PO TABS: 10.0000 mg | ORAL_TABLET | Freq: Every day | ORAL | Status: DC

## 2011-10-24 NOTE — Patient Instructions (Signed)
Take amiodarone 100mg  daily--this will be one-half of a 200mg  tablet daily.  Take atorvastatin(lipitor) 10mg  daily.   Your physician recommends that you have  lab work today--Liver profile/TSH 427.31  428.32  Your physician recommends that you schedule a follow-up appointment the end of March 2013.

## 2011-10-24 NOTE — Progress Notes (Signed)
PCP: Dr. Perrin Maltese  77 yo with paroxysmal atrial fibrillation and diastolic CHF presents for followup.  He was hospitalized twice in 2/11 with atrial fibrillation and rapid ventricular response.  While in the hospital, he was diuresed for acute diastolic CHF.  He has been cardioverted twice now and remains in NSR today. He has been on amiodarone.  He is now living in an assisted living.  He was on Pradaxa in the past but this was stopped after an episode of significant GI bleeding, and he is on aspirin only at this time.  Henry Meyers was hospitalized last month with a presumed seizure.  Head CT showed an old left posterior frontal infarct but nothing acute.  The old CVA could have been a focus for a seizure.  He was started on Keppra.  He thinks that the Keppra is making him feel tired.   Symptomatically doing well currently.  No tachypalpitations.   He walks his dog several times daily for exercise.  No dyspnea walking on flat ground with mild dyspnea walking up a flight of steps.  No chest pain.   No falls.  He is steady on his feet.  Weight is up 8 lbs since last appointment.  He says that he has been eating better at assisted living.  Lower extremity edema is unchanged.   Labs (2/11): BNP 170, K 4.3, creatinine 1.1, LDL 77, HDL 70, TSH normal, LFTs normal Labs (3/11): K 4.3, creatinine 1.1 Labs (6/11): HCT 39.2 Labs (7/11): TSH normal, LFTs normal Labs (8/11): K 4.1, creatinine 1.1 Labs (12/11): K 4.9, creatinine 1.4, HCT 38.5, LFTs normal, TSH normal Labs (4/12): LFTs normal, LDL 86, HDL 77, BNP 206, K 4.6, creatinine 1.2 Labs (8/12): BNP 124 Labs (11/12): K 4.1, creatinine 0.86  Allergies (verified):  No Known Drug Allergies  Past Medical History: 1. Hypertension  2. Hyperlipidemia 3. History of nonsustained ventricular tachycardia in 2004 after wrist surgery. 4. History of prostate cancer, status post radioactive seeds and now on Lupron therapy. 5. Diastolic CHF: Echo (2/11) with EF 50-55%,  mild left atrial enlargement, no significant valvular abnormalities. 6. Atrial fibrillation: Poorly tolerated, rapid response and develops CHF.  He is now on amiodarone.  PFTs (3/11) with mild obstructive defect, normal diffusion.  Pradaxa was stopped in [place] in 1/12 because of GI bleed.  7. Elevated troponin in the setting of atrial fibrillation with RVR: Lexiscan myoview in 3/11 showed EF 76%, no ischemia or infarction.   8. GI bleed in the setting of colitis.  Pradaxa stopped (1/12).  9. CVA: old CVA seen on head CT 11/12.  10.  Seizure 11/12: prior CVA may have been focus for seizure activity.   Family History: Mother died at age 56.  Father died at age 38 due to  Parkinson disease.  There is no family history of premature coronary artery disease.  Social History: [place] lives in [place].  He he is a widower and retired. Denies any tobacco use.  He drinks alcohol socially. 2 sons, closest lives in [place] and the other lives in [place].   He is now in an assisted living facility.   Review of Systems        All systems reviewed and negative except as per HPI.   Current Outpatient Prescriptions  Medication Sig Dispense Refill  . aspirin 81 MG tablet Take 81 mg by mouth daily.        . benazepril (LOTENSIN) 10 MG tablet Take 1 tablet (10 mg  total) by mouth daily.  90 tablet  3  . bimatoprost (LUMIGAN) 0.03 % ophthalmic drops as directed.        . diltiazem (CARDIZEM) 120 MG tablet Take 1 tablet (120 mg total) by mouth daily.  90 tablet  3  . levETIRAcetam (KEPPRA) 500 MG tablet Take 1 tablet (500 mg total) by mouth 2 (two) times daily.  60 tablet  0  . Multiple Vitamin (MULTIVITAMIN) tablet Take 1 tablet by mouth daily.        . nitroGLYCERIN (NITROSTAT) 0.4 MG SL tablet Place 0.4 mg under the tongue every 5 (five) minutes as needed.        Marland Kitchen spironolactone (ALDACTONE) 25 MG tablet 1 tab Mon,Wed, Friday  30 tablet  12  . DISCONTD: amiodarone (PACERONE) 200 MG tablet Take 0.5  tablets (100 mg total) by mouth daily.  30 tablet  6  . DISCONTD: atorvastatin (LIPITOR) 10 MG tablet Take 1 tablet (10 mg total) by mouth daily.  30 tablet  11  . amiodarone (PACERONE) 200 MG tablet 1/2 tablet daily --this will be 100mg  daily      . atorvastatin (LIPITOR) 10 MG tablet Take 1 tablet (10 mg total) by mouth daily.  30 tablet  6    BP 140/82  Pulse 64  Ht 5\' 6"  (1.676 m)  Wt 69.001 kg (152 lb 1.9 oz)  BMI 24.55 kg/m2 General:  Well developed, well nourished, in no acute distress. Neck:  Neck supple, no JVD. No masses, thyromegaly or abnormal cervical nodes. Lungs:  Clear bilaterally to auscultation and percussion. Heart:  Non-displaced PMI, chest non-tender; regular rate and rhythm, S1, S2 without murmurs, rubs. +S4. Carotid upstroke normal, no bruit.  Pedals normal pulses. 1+ edema 1/2 to knees bilaterally.  Abdomen:  Bowel sounds positive; abdomen soft and non-tender without masses, organomegaly, or hernias noted. No hepatosplenomegaly. Extremities:  No clubbing or cyanosis. Neurologic:  Alert and oriented x 3. Psych:  Normal affect.

## 2011-10-25 NOTE — Assessment & Plan Note (Signed)
Lipids reasonable last checked.  As he is on a low dose of statin and has had no side effects from it, I think it is reasonable for him to continue.

## 2011-10-25 NOTE — Assessment & Plan Note (Signed)
Patient is not in atrial fibrillation today.  He tolerates atrial fibrillation poorly.  I will have him continue amiodarone 100 mg daily as well as diltiazem CD.   - He will remain on ASA only for anticoagulation given severe GI bleed on Pradaxa.  He has had a prior history of CVA so unfortunately is at considerable risk for stroke off anticoagulation but considerable risk for GI bleed with anticoagulation. I think that the risk-benefit ratio falls towards aspirin only given his severe GI bleed last year.   - Will check LFTs and TSH today (amiodarone use).  He follows up regularly with an eye doctor.

## 2011-10-25 NOTE — Assessment & Plan Note (Signed)
Minimal volume overload.  He was apparently taken off Lasix due to dehydration last winter.  He is now on spironolactone 3 times a week.  He is symptomatically stable (NYHA class II) with peripheral edema but no JVD.  I am not going to change his regimen today.

## 2011-10-31 ENCOUNTER — Other Ambulatory Visit (HOSPITAL_COMMUNITY): Payer: Self-pay | Admitting: Cardiology

## 2011-11-05 ENCOUNTER — Telehealth: Payer: Self-pay | Admitting: Neurology

## 2011-11-05 ENCOUNTER — Other Ambulatory Visit: Payer: Self-pay | Admitting: Neurology

## 2011-11-05 NOTE — Telephone Encounter (Signed)
Pt would like Keppra refilled.

## 2011-11-05 NOTE — Telephone Encounter (Signed)
Pt would like his Keppra refilled.

## 2011-11-05 NOTE — Telephone Encounter (Signed)
Pt would like refill on his Keppra.  His f/u with you isn't until 11/29/11.  Ok to refill?

## 2011-11-06 ENCOUNTER — Other Ambulatory Visit: Payer: Self-pay

## 2011-11-06 MED ORDER — LEVETIRACETAM 500 MG PO TABS: 500.0000 mg | ORAL_TABLET | Freq: Two times a day (BID) | ORAL | Status: DC

## 2011-11-06 NOTE — Telephone Encounter (Signed)
Pt called again about his Keppra refill. He states he is out of medication.

## 2011-11-06 NOTE — Telephone Encounter (Signed)
Never seen him before.  Although since he was seen by the neurohospitalists ok to refill.

## 2011-11-29 ENCOUNTER — Ambulatory Visit (INDEPENDENT_AMBULATORY_CARE_PROVIDER_SITE_OTHER): Payer: Medicare Other | Admitting: Neurology

## 2011-11-29 ENCOUNTER — Encounter: Payer: Self-pay | Admitting: Neurology

## 2011-11-29 VITALS — BP 138/80 | HR 76 | Wt 154.0 lb

## 2011-11-29 DIAGNOSIS — I639 Cerebral infarction, unspecified: Secondary | ICD-10-CM

## 2011-11-29 DIAGNOSIS — I6350 Cerebral infarction due to unspecified occlusion or stenosis of unspecified cerebral artery: Secondary | ICD-10-CM

## 2011-11-29 MED ORDER — ZONISAMIDE 100 MG PO CAPS: ORAL_CAPSULE | ORAL | Status: DC

## 2011-11-29 NOTE — Patient Instructions (Addendum)
start Zonegran(zonisamide) 100mg  at night for 1 week, then increase to 200mg  at night from then on.  When you are on 200mg  at night of zonisamide you can stop your Keppra.  Your carotid ultrasound is scheduled at [facility] on Tuesday, January 15th at 10:00 am. Please check in at first floor admitting at 9:45 am. [phone].  Your follow up appointment with Dr. Modesto Charon is April 11th, 2013 at 2:30 pm.  Your prescription has been sent to your pharmacy.

## 2011-11-29 NOTE — Progress Notes (Signed)
Dear Dr. Perrin Maltese,  Thank you for having me see Henry Meyers in consultation today at [facility] for his problem with possible seizures.  As you may recall, he is a 78 y.o. year old male with a history of atrial fibrillation, HLD, HTN, diastolic heart failure who presented in November of 2012 with a spell of loss of consciousness followed by combativeness and confusion, as well as biting of his lip.    Unfortunately the patient does not remember the spell.  The only other spell of loss of consciousness was one about a year earlier where he felt "unwell" and was given a diagnosis of passing out because he was dehydrated.  He denies any other similar spells.  During hospitalization because of the history of he was started on Keppra and this was increased to 500 bid.  An EEG was unremarkable.  MRA head showed mild atherosclerosis.  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 I could not find a record of a carotid doppler.  He feels the Keppra is making him tired throughout the day, which is relieved later on in the day.  Past Medical History  Diagnosis Date  . HTN (hypertension)   . HLD (hyperlipidemia)   . Ventricular tachycardia, non-sustained 2004  . Prostate cancer   . Diastolic CHF, chronic   . Atrial fibrillation   . Elevated troponin   . Heart murmur   . Seizures   . CHF (congestive heart failure)   - no history of severe head trauma, febrile seizures, intracranial infections. - he is on aspirin for his Afib because of his severe GI bleed when on Pradaxa.  Past Surgical History  Procedure Date  . Wrist surgery     History   Social History  . Marital Status: Widowed    Spouse Name: N/A    Number of Children: 2  . Years of Education: 14+   Occupational History  . retired    Social History Main Topics  . Smoking status: Never Smoker   . Smokeless tobacco: Never Used  . Alcohol Use: Yes     socially  . Drug Use: No  . Sexually Active: No     Other Topics Concern  . None   Social History Narrative   Lives at [facility].Retired Passenger transport manager, worked for [employer]  Has two sons; one lives in [place] and one in [place].  Does not have any family in [place], but does have lots of friends here.    Family History  Problem Relation Age of Onset  . Parkinsonism Father     Current Outpatient Prescriptions on File Prior to Visit  Medication Sig Dispense Refill  . amiodarone (PACERONE) 200 MG tablet 1/2 tablet daily --this will be 100mg  daily      . aspirin 81 MG tablet Take 81 mg by mouth daily.        Marland Kitchen atorvastatin (LIPITOR) 10 MG tablet Take 1 tablet (10 mg total) by mouth daily.  30 tablet  6  . benazepril (LOTENSIN) 10 MG tablet Take 1 tablet (10 mg total) by mouth daily.  90 tablet  3  . bimatoprost (LUMIGAN) 0.03 % ophthalmic drops as directed.        . diltiazem (CARDIZEM) 120 MG tablet Take 1 tablet (120 mg total) by mouth daily.  90 tablet  3  . levETIRAcetam (KEPPRA) 500 MG tablet Take 1 tablet (500 mg total) by mouth 2 (two) times daily.  60 tablet  0  . Multiple Vitamin (MULTIVITAMIN) tablet Take 1 tablet by mouth daily.        . nitroGLYCERIN (NITROSTAT) 0.4 MG SL tablet Place 0.4 mg under the tongue every 5 (five) minutes as needed.        Marland Kitchen spironolactone (ALDACTONE) 25 MG tablet 1 tab Mon,Wed, Friday  30 tablet  12    No Known Allergies    ROS:  13 systems were reviewed and are notable for gait dysfunction secondary to his left leg being shorter than his right would has caused significant hip discomfort in the left leg.  All other review of systems are unremarkable.   Examination:  Filed Vitals:   11/29/11 1315  BP: 138/80  Pulse: 76  Weight: 154 lb (69.854 kg)     In general, well appearing older man.  Cardiovascular: The patient has no carotid bruits.  Fundoscopy:  Visualization of the disks are limited by bilateral cataracts.  Mental status:    The patient is oriented to person, place and time. Recent and remote memory are intact. Attention span and concentration are normal. Language including repetition, naming, following commands are intact. Fund of knowledge of current and historical events, as well as vocabulary are normal.  Cranial Nerves: Pupils are equally round and reactive to light. Reveal possible superior quadrantanopsia in right eye only. Extraocular movements are intact without nystagmus. Facial sensation and muscles of mastication are intact. Muscles of facial expression are symmetric. Hearing decreased to bilateral finger run. Tongue protrusion, uvula, palate midline.  Shoulder shrug intact  Motor:  The patient has normal bulk and tone, no pronator drift.  There are no adventitious movements.  5/5 bilaterally.  Reflexes:   Biceps  Triceps Brachioradialis Knee Ankle  Right 2+  2+  2+   2+ 0  Left  2+  2+  2+   2+ 0  Toes down bilaterally.  Coordination:  Mild intention tremor bilaterally.  No dysdiadokinesia.  Sensation is decreased to temp and vibration in his feet and hands.  Position sense normal.  Gait and Station very impaired.  Likely due to his shorter left leg, but he does circumduct his right leg.   MRI brain reviewed - notable for old  high parietal infarct on left  Impression/Recs: Spell of loss of consciousness with post-spell confusion thought likely to be a seizure.  Likely of focal onset from old left parietal infarct.  I did not get a definite history of a previous seizure although the spell of dehydration is suspicious.  I could not find a carotid U/S although his stroke is likely from his atrial fibrillation.  However, anti-coagulation is contraindicated due to his history of GI bleed.  As he is seeming to have significant fatigue from his Keppra I am going to switch him to 200mg  qhs of Zonegran.  When he starts the 200mg  qhs of Zonegran he can stop his Keppra.   We will see the patient back in  3 months.  Thank you for having [place] see Henry Meyers in consultation.  Feel free to contact me with any questions.  Lupita Raider Modesto Charon, MD [facility], [place] [address] [place], [place] [address] Phone: [phone] Fax: [phone].

## 2011-11-30 ENCOUNTER — Telehealth: Payer: Self-pay | Admitting: Neurology

## 2011-11-30 NOTE — Telephone Encounter (Signed)
Pt's son has a few questions about his father's visit yesterday.

## 2011-12-04 ENCOUNTER — Ambulatory Visit (HOSPITAL_COMMUNITY): Payer: Medicare Other

## 2011-12-06 ENCOUNTER — Encounter: Payer: Self-pay | Admitting: Neurology

## 2011-12-06 NOTE — Telephone Encounter (Signed)
called and spoke to the patient's son and talked to him at length about his fathers new medicaiton and him not being able to drive.  patient is going to rebook his carotid u/s until his son can take him in Feb.

## 2011-12-12 ENCOUNTER — Other Ambulatory Visit: Payer: Self-pay

## 2011-12-12 ENCOUNTER — Telehealth: Payer: Self-pay | Admitting: Neurology

## 2011-12-12 DIAGNOSIS — I639 Cerebral infarction, unspecified: Secondary | ICD-10-CM

## 2011-12-12 NOTE — Telephone Encounter (Signed)
Notified pt's son, Loraine Leriche of rescheduled appt for carotid dopplers on 01/01/12 at 11:00am [address].

## 2011-12-12 NOTE — Telephone Encounter (Signed)
Pt needs to be scheduled for "ultrasound of neck." Pt's son will be in town from [place] the week of Feb 11th, so he was wondering if the procedure could be scheduled either the afternoon of 2/11 or anytime on 2/12, 2/13, or 2/14. Please call pt's son once appointment has been scheduled.

## 2011-12-12 NOTE — Telephone Encounter (Signed)
Pt rescheduled for 01/01/12 at 11am [place], pt's son aware.

## 2012-01-01 ENCOUNTER — Ambulatory Visit (HOSPITAL_COMMUNITY)
Admission: RE | Admit: 2012-01-01 | Discharge: 2012-01-01 | Disposition: A | Discharge status: Home / Self Care | Payer: Medicare Other | Source: Ambulatory Visit | Attending: Neurology | Admitting: Neurology

## 2012-01-01 DIAGNOSIS — I639 Cerebral infarction, unspecified: Secondary | ICD-10-CM

## 2012-01-01 DIAGNOSIS — I4891 Unspecified atrial fibrillation: Secondary | ICD-10-CM | POA: Insufficient documentation

## 2012-01-01 DIAGNOSIS — I6350 Cerebral infarction due to unspecified occlusion or stenosis of unspecified cerebral artery: Secondary | ICD-10-CM | POA: Insufficient documentation

## 2012-01-01 NOTE — Progress Notes (Signed)
Preliminary Results :  Bilateral:  No evidence of hemodynamically significant internal carotid artery stenosis.   Vertebral artery flow is antegrade.       Hallelujah Wysong, [place] D., RVS 12/31/2101  11:48 AM

## 2012-01-02 ENCOUNTER — Telehealth: Payer: Self-pay | Admitting: Neurology

## 2012-01-02 NOTE — Telephone Encounter (Signed)
Called and spoke with the patient. Information given as directed by Dr. Modesto Charon re: normal carotid doppler study. The patient had no additional concerns/issues at this time.

## 2012-01-02 NOTE — Telephone Encounter (Signed)
Message copied by Benay Spice on Wed Jan 02, 2012  4:18 PM ------      Message from: Milas Gain      Created: Wed Jan 02, 2012  3:40 PM       please let Mr. Elie know that his carotid doppler test was normal

## 2012-01-08 ENCOUNTER — Telehealth: Payer: Self-pay | Admitting: Neurology

## 2012-01-08 NOTE — Telephone Encounter (Signed)
Called and spoke with the patient's son, Henry Meyers. After discussion with Dr. Modesto Charon earlier, informed Henry Meyers that it was ok for his Dad to receive the necessary anesthesia for the removal of his cataract. No other concerns voiced at this time. Henry Meyers states he will accompany his Dad to the next appointment with Dr. Modesto Charon.

## 2012-01-08 NOTE — Telephone Encounter (Signed)
Pt is scheduled to have cataract surgery on March 3rd and they are going to sedate the pt heavily. Son states that the surgeon called it the "twilight zone." He wants to make sure that this sedation process is safe for the pt and also to make sure pt can still come to his fu appt scheduled with Dr. Modesto Charon on March 4th. Please call back at son's mobile phone number and advise.

## 2012-01-17 ENCOUNTER — Encounter: Payer: Self-pay | Admitting: *Deleted

## 2012-01-21 ENCOUNTER — Encounter: Payer: Self-pay | Admitting: Neurology

## 2012-01-21 ENCOUNTER — Ambulatory Visit (INDEPENDENT_AMBULATORY_CARE_PROVIDER_SITE_OTHER): Payer: Medicare Other | Admitting: Neurology

## 2012-01-21 ENCOUNTER — Ambulatory Visit (INDEPENDENT_AMBULATORY_CARE_PROVIDER_SITE_OTHER): Payer: Medicare Other | Admitting: Cardiology

## 2012-01-21 ENCOUNTER — Encounter: Payer: Self-pay | Admitting: Cardiology

## 2012-01-21 VITALS — BP 170/94 | HR 66 | Ht 66.0 in | Wt 148.8 lb

## 2012-01-21 VITALS — BP 124/80 | HR 64 | Ht 66.0 in | Wt 149.0 lb

## 2012-01-21 DIAGNOSIS — I5032 Chronic diastolic (congestive) heart failure: Secondary | ICD-10-CM

## 2012-01-21 DIAGNOSIS — E785 Hyperlipidemia, unspecified: Secondary | ICD-10-CM

## 2012-01-21 DIAGNOSIS — R569 Unspecified convulsions: Secondary | ICD-10-CM | POA: Insufficient documentation

## 2012-01-21 DIAGNOSIS — I509 Heart failure, unspecified: Secondary | ICD-10-CM

## 2012-01-21 DIAGNOSIS — I4891 Unspecified atrial fibrillation: Secondary | ICD-10-CM

## 2012-01-21 LAB — HEPATIC FUNCTION PANEL
ALT: 17 U/L (ref 0–53)
AST: 18 U/L (ref 0–37)
Alkaline Phosphatase: 60 U/L (ref 39–117)
Bilirubin, Direct: 0.1 mg/dL (ref 0.0–0.3)
Total Bilirubin: 0.4 mg/dL (ref 0.3–1.2)

## 2012-01-21 LAB — BASIC METABOLIC PANEL
CO2: 24 mEq/L (ref 19–32)
Calcium: 9.2 mg/dL (ref 8.4–10.5)
Creatinine, Ser: 1.1 mg/dL (ref 0.4–1.5)
GFR: 69.07 mL/min (ref >60.00)
Glucose, Bld: 106 mg/dL — ABNORMAL HIGH (ref 70–99)
Sodium: 139 mEq/L (ref 135–145)

## 2012-01-21 LAB — LIPID PANEL: Total CHOL/HDL Ratio: 3

## 2012-01-21 LAB — TSH: TSH: 1.62 u[IU]/mL (ref 0.35–5.50)

## 2012-01-21 MED ORDER — ZONISAMIDE 100 MG PO CAPS: 200.0000 mg | ORAL_CAPSULE | Freq: Every day | ORAL | Status: DC

## 2012-01-21 NOTE — Progress Notes (Signed)
PCP: Dr. Perrin Maltese  78 yo with paroxysmal atrial fibrillation and diastolic CHF presents for followup.  He was hospitalized twice in 2/11 with atrial fibrillation and rapid ventricular response.  While in the hospital, he was diuresed for acute diastolic CHF.  He has been cardioverted twice now and remains in NSR today. He has been on amiodarone.  He is now living in an assisted living.  He was on Pradaxa in the past but this was stopped after an episode of significant GI bleeding, and he is on aspirin only at this time.  Mr Salminen was hospitalized in the fall with a presumed seizure.  Head CT showed an old left posterior frontal infarct but nothing acute.  The old CVA could have been a focus for a seizure.  He was started on Keppra but thought it made him feel tired so is now taking Zonegran.   Symptomatically doing well currently.  No tachypalpitations.   He walks his dog up to 6 times daily and rides his stationary bike for 10 minutes at a time.  No dyspnea walking on flat ground with mild dyspnea walking up a flight of steps.  No chest pain.   No falls.  He is steady on his feet.  Main limitation is hip and back pain.  No melena or hematochezia.  Weight is down 4 lbs since last appointment.  He says that he has been eating better at assisted living.  Lower extremity edema is unchanged.  He has not been driving since the seizure.    Labs (2/11): BNP 170, K 4.3, creatinine 1.1, LDL 77, HDL 70, TSH normal, LFTs normal Labs (3/11): K 4.3, creatinine 1.1 Labs (6/11): HCT 39.2 Labs (7/11): TSH normal, LFTs normal Labs (8/11): K 4.1, creatinine 1.1 Labs (12/11): K 4.9, creatinine 1.4, HCT 38.5, LFTs normal, TSH normal Labs (4/12): LFTs normal, LDL 86, HDL 77, BNP 206, K 4.6, creatinine 1.2 Labs (8/12): BNP 124 Labs (11/12): K 4.1, creatinine 0.86 Labs (12/12): TSH, LFTs normal  ECG: NSR, normal  Allergies (verified):  No Known Drug Allergies  Past Medical History: 1. Hypertension  2.  Hyperlipidemia 3. History of nonsustained ventricular tachycardia in 2004 after wrist surgery. 4. History of prostate cancer, status post radioactive seeds and now on Lupron therapy. 5. Diastolic CHF: Echo (2/11) with EF 50-55%, mild left atrial enlargement, no significant valvular abnormalities. 6. Atrial fibrillation: Poorly tolerated, rapid response and develops CHF.  He is now on amiodarone.  PFTs (3/11) with mild obstructive defect, normal diffusion.  Pradaxa was stopped in [place] in 1/12 because of life-threatening GI bleed and we decided not to restart anticoagulation.  7. Elevated troponin in the setting of atrial fibrillation with RVR: Lexiscan myoview in 3/11 showed EF 76%, no ischemia or infarction.   8. GI bleed in the setting of colitis.  Pradaxa stopped (1/12).  9. CVA: old CVA seen on head CT 11/12.  Carotid dopplers (2/13) were normal.  10.  Seizure 11/12: prior CVA may have been focus for seizure activity.   Family History: Mother died at age 18.  Father died at age 65 due to  Parkinson disease.  There is no family history of premature coronary artery disease.  Social History: He lives in [place].  He he is a widower and retired. Denies any tobacco use.  He drinks alcohol socially. 2 sons, closest lives in [place] and the other lives in [place].   He is now in an assisted living facility.  Current Outpatient Prescriptions  Medication Sig Dispense Refill  . amiodarone (PACERONE) 200 MG tablet 1/2 tablet daily --this will be 100mg  daily      . aspirin 81 MG tablet Take 81 mg by mouth daily.        Marland Kitchen atorvastatin (LIPITOR) 10 MG tablet Take 1 tablet (10 mg total) by mouth daily.  30 tablet  6  . benazepril (LOTENSIN) 10 MG tablet Take 1 tablet (10 mg total) by mouth daily.  90 tablet  3  . bimatoprost (LUMIGAN) 0.03 % ophthalmic drops as directed.        . diltiazem (CARDIZEM) 120 MG tablet Take 1 tablet (120 mg total) by mouth daily.  90 tablet  3  . Multiple  Vitamin (MULTIVITAMIN) tablet Take 1 tablet by mouth daily.        . nitroGLYCERIN (NITROSTAT) 0.4 MG SL tablet Place 0.4 mg under the tongue every 5 (five) minutes as needed.        Marland Kitchen spironolactone (ALDACTONE) 25 MG tablet 1 tab Mon,Wed, Friday  30 tablet  12  . zonisamide (ZONEGRAN) 100 MG capsule Take 200 mg by mouth daily. for 1 week take 100mg  at night, then increase to 200mg  at night from then on.        BP 170/94  Pulse 66  Ht 5\' 6"  (1.676 m)  Wt 148 lb 12.8 oz (67.495 kg)  BMI 24.02 kg/m2 General:  Well developed, well nourished, in no acute distress. Neck:  Neck supple, no JVD. No masses, thyromegaly or abnormal cervical nodes. Lungs:  Clear bilaterally to auscultation and percussion. Heart:  Non-displaced PMI, chest non-tender; regular rate and rhythm, S1, S2 without murmurs, rubs. +S4. Carotid upstroke normal, no bruit.  Pedals normal pulses. 1+ edema 1/2 to knees bilaterally.  Abdomen:  Bowel sounds positive; abdomen soft and non-tender without masses, organomegaly, or hernias noted. No hepatosplenomegaly. Extremities:  No clubbing or cyanosis. Neurologic:  Alert and oriented x 3. Psych:  Normal affect.

## 2012-01-21 NOTE — Progress Notes (Signed)
Dear Dr. Perrin Maltese,  I saw  Henry Meyers back in [facility] clinic for his problem with a probable seizure secondary to an old left parietal infarct .  As you may recall, he is a 78 y.o. year old male with a history of atrial fibrillation who had a suspicious loss of consciousness in November of 2012.  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  He was started on Keppra 500 bid but I switched this to ZNS 200 qhs as he was having fatigue from the Keppra.  He has not had any further spells of LOC.  He is wondering when he can return to driving.  He is otherwise tolerating the ZNS well.  Medical history, social history, and family history were reviewed and have not changed since the last clinic visit.  Current Outpatient Prescriptions on File Prior to Visit  Medication Sig Dispense Refill  . amiodarone (PACERONE) 200 MG tablet 1/2 tablet daily --this will be 100mg  daily      . aspirin 81 MG tablet Take 81 mg by mouth daily.        Marland Kitchen atorvastatin (LIPITOR) 10 MG tablet Take 1 tablet (10 mg total) by mouth daily.  30 tablet  6  . benazepril (LOTENSIN) 10 MG tablet Take 1 tablet (10 mg total) by mouth daily.  90 tablet  3  . bimatoprost (LUMIGAN) 0.03 % ophthalmic drops as directed.        . diltiazem (CARDIZEM) 120 MG tablet Take 1 tablet (120 mg total) by mouth daily.  90 tablet  3  . Multiple Vitamin (MULTIVITAMIN) tablet Take 1 tablet by mouth daily.        . nitroGLYCERIN (NITROSTAT) 0.4 MG SL tablet Place 0.4 mg under the tongue every 5 (five) minutes as needed.        Marland Kitchen spironolactone (ALDACTONE) 25 MG tablet 1 tab Mon,Wed, Friday  30 tablet  12    No Known Allergies  ROS:  13 systems were reviewed and are notable for decreased vision in right eye -- due to get cataract surgery tomorrow.  All other review of systems are unremarkable.  Exam: . Filed Vitals:   01/21/12 1501  BP: 124/80  Pulse: 64  Height: 5\' 6"  (1.676 m)  Weight: 149 lb (67.586 kg)    In  general, well appearing older man.  Mental status:   The patient is oriented to person, place and time. Recent and remote memory are intact. Attention span and concentration are normal. Language including repetition, naming, following commands are intact. Fund of knowledge of current and historical events, as well as vocabulary are normal.  Cranial Nerves: Pupils are equally round and reactive to light. Visual fields full to confrontation. Extraocular movements are intact with end gaze nystagmus.   Facial sensation and muscles of mastication are intact. Muscles of facial expression are symmetric. Tongue protrusion, uvula, palate midline.  Shoulder shrug intact  Motor:  Normal bulk and tone, no drift, uses both side equally.  Reflexes:  Symmetric.  Coordination:  Normal finger to nose  Impression/Recommendations:  1.  Focal seizures - I will continue the ZNS at 200 qhs.  He wants to return to driving, but he will have to wait 3 months.  I would consider a wean of the ZNS in 2 years.  We will see the patient back in 3 months.  Lupita Raider Modesto Charon, MD [facility], [place]

## 2012-01-21 NOTE — Assessment & Plan Note (Signed)
Minimal volume overload.  He is on spironolactone 3 times a week.  He is symptomatically stable (NYHA class II) with peripheral edema but no JVD.  I am not going to change his regimen today.  I will check a BMET given use of spironolactone.

## 2012-01-21 NOTE — Patient Instructions (Signed)
Your physician recommends that you have lab work today--lipid profile/liver profile/TSH/BMET 3  Your physician wants you to follow-up in: June 2013.  You will receive a reminder letter in the mail two months in advance. If you don't receive a letter, please call our office to schedule the follow-up appointment.  You can also  call our office when you know your schedule to schedule the apppointment.

## 2012-01-21 NOTE — Assessment & Plan Note (Signed)
Check lipids today 

## 2012-01-21 NOTE — Assessment & Plan Note (Signed)
Patient is not in atrial fibrillation today.  He tolerates atrial fibrillation poorly.  I will have him continue amiodarone 100 mg daily as well as diltiazem CD.   - He will remain on ASA only for anticoagulation given severe GI bleed on Pradaxa.  He has had a prior history of CVA so unfortunately is at considerable risk for stroke off anticoagulation but considerable risk for GI bleed with anticoagulation. I think that the risk-benefit ratio falls towards aspirin only given his severe GI bleed last year.   - Will check LFTs and TSH today (amiodarone use).  He follows up regularly with an eye doctor.  

## 2012-01-22 ENCOUNTER — Ambulatory Visit: Payer: Medicare Other | Admitting: Cardiology

## 2012-01-24 ENCOUNTER — Other Ambulatory Visit: Payer: Self-pay | Admitting: Neurology

## 2012-01-24 MED ORDER — ZONISAMIDE 100 MG PO CAPS: 200.0000 mg | ORAL_CAPSULE | Freq: Every day | ORAL | Status: DC

## 2012-01-28 ENCOUNTER — Encounter: Payer: Self-pay | Admitting: Internal Medicine

## 2012-01-28 ENCOUNTER — Ambulatory Visit (INDEPENDENT_AMBULATORY_CARE_PROVIDER_SITE_OTHER): Payer: Medicare Other | Admitting: Internal Medicine

## 2012-01-28 VITALS — BP 134/81 | HR 76 | Temp 98.1°F | Resp 16 | Ht 63.0 in | Wt 152.0 lb

## 2012-01-28 DIAGNOSIS — I10 Essential (primary) hypertension: Secondary | ICD-10-CM

## 2012-01-28 DIAGNOSIS — Z79899 Other long term (current) drug therapy: Secondary | ICD-10-CM

## 2012-01-28 DIAGNOSIS — I4891 Unspecified atrial fibrillation: Secondary | ICD-10-CM

## 2012-01-28 DIAGNOSIS — Z7189 Other specified counseling: Secondary | ICD-10-CM

## 2012-01-28 DIAGNOSIS — C61 Malignant neoplasm of prostate: Secondary | ICD-10-CM

## 2012-01-28 DIAGNOSIS — R009 Unspecified abnormalities of heart beat: Secondary | ICD-10-CM

## 2012-01-28 NOTE — Progress Notes (Signed)
  Subjective:    Patient ID: Henry Meyers, male    DOB: September 26, 1920, 78 y.o.   MRN: [id number]  HPI Stable all issues   Review of Systems     Objective:   Physical Exam  Leg 2+ edema  Heart normal  Lungs clear      Assessment & Plan:   Refill meds 1 yr F/up 6 mos.

## 2012-02-02 ENCOUNTER — Ambulatory Visit (INDEPENDENT_AMBULATORY_CARE_PROVIDER_SITE_OTHER): Payer: Medicare Other | Admitting: Internal Medicine

## 2012-02-02 ENCOUNTER — Telehealth: Payer: Self-pay

## 2012-02-02 VITALS — BP 173/78 | HR 78 | Temp 97.9°F | Resp 16 | Ht 63.0 in | Wt 152.0 lb

## 2012-02-02 DIAGNOSIS — S81009A Unspecified open wound, unspecified knee, initial encounter: Secondary | ICD-10-CM

## 2012-02-02 DIAGNOSIS — M79609 Pain in unspecified limb: Secondary | ICD-10-CM

## 2012-02-02 DIAGNOSIS — S81809A Unspecified open wound, unspecified lower leg, initial encounter: Secondary | ICD-10-CM

## 2012-02-02 DIAGNOSIS — L03119 Cellulitis of unspecified part of limb: Secondary | ICD-10-CM

## 2012-02-02 MED ORDER — MUPIROCIN 2 % EX OINT: TOPICAL_OINTMENT | Freq: Three times a day (TID) | CUTANEOUS | Status: AC

## 2012-02-02 MED ORDER — DOXYCYCLINE HYCLATE 100 MG PO TABS: 100.0000 mg | ORAL_TABLET | Freq: Two times a day (BID) | ORAL | Status: AC

## 2012-02-02 NOTE — Telephone Encounter (Signed)
Pt states he wants to talk with dr guest before coming in to see him today

## 2012-02-02 NOTE — Telephone Encounter (Signed)
Pt seen today in office. 

## 2012-02-02 NOTE — Progress Notes (Signed)
  Subjective:    Patient ID: Henry Meyers, male    DOB: 1919/12/08, 78 y.o.   MRN: [id number]  HPI Wound leg    Review of Systems     Objective:   Physical Exam Leg wound with avulsion, 10 d old Early erythema and tenderness           Assessment & Plan:  Wound leg Soap and water bid Doxy bid Mupirocin bid

## 2012-02-11 ENCOUNTER — Ambulatory Visit: Payer: Medicare Other | Admitting: Cardiology

## 2012-02-12 ENCOUNTER — Ambulatory Visit (INDEPENDENT_AMBULATORY_CARE_PROVIDER_SITE_OTHER): Payer: Medicare Other | Admitting: Internal Medicine

## 2012-02-12 VITALS — BP 150/84 | HR 79 | Temp 98.1°F | Resp 20

## 2012-02-12 DIAGNOSIS — S01502A Unspecified open wound of oral cavity, initial encounter: Secondary | ICD-10-CM

## 2012-02-12 DIAGNOSIS — S81809A Unspecified open wound, unspecified lower leg, initial encounter: Secondary | ICD-10-CM

## 2012-02-12 NOTE — Progress Notes (Signed)
  Subjective:    Patient ID: Henry Meyers, male    DOB: February 27, 1920, 78 y.o.   MRN: [id number]  HPI Wound reck, doing well. Still minor oozing of serous fluid.  Review of Systems     Objective:   Physical Exam   Wound lower leg 1 cm almost healed.  NMV intact      Assessment & Plan:  Wound cleaned and dressed. RTC prn

## 2012-02-18 ENCOUNTER — Telehealth: Payer: Self-pay | Admitting: Neurology

## 2012-02-18 NOTE — Telephone Encounter (Signed)
Pt's son Henry Meyers states that in previous visits, they have discussed strange smells in conjunction with the onset of a seizure. He wanted to let Dr. Modesto Charon know that the patient experienced such a smell on Saturday (02/16/2012), but had no further recognized seizure activity following the smell. Pt has a follow up appt scheduled 05/06/2012. Please call Henry Meyers back with any further questions.

## 2012-02-19 NOTE — Telephone Encounter (Signed)
Jan - Have him let us know if he experiences any other "smells".  If so then I would likely increase his zonisamide.

## 2012-02-19 NOTE — Telephone Encounter (Signed)
Called and spoke with the patient. Information given as per Dr. Modesto Charon below. Advised the patient to call the office if he experiences additional "smells" and/or a seizure. The patient understands and states he will call in the future.

## 2012-02-28 ENCOUNTER — Ambulatory Visit: Payer: Medicare Other | Admitting: Neurology

## 2012-04-07 ENCOUNTER — Other Ambulatory Visit: Payer: Self-pay | Admitting: Neurology

## 2012-04-07 MED ORDER — ZONISAMIDE 100 MG PO CAPS: 200.0000 mg | ORAL_CAPSULE | Freq: Every day | ORAL | Status: DC

## 2012-04-07 NOTE — Telephone Encounter (Signed)
Pt would like refill on zonegran sent to [facility] order pharmacy. Pt has 10 pills left.

## 2012-04-07 NOTE — Telephone Encounter (Signed)
Spoke with the patient's son. Aware med refill completed.

## 2012-04-17 IMAGING — CT CT HEAD W/O CM
1 of 2 series · 13 of 30 positions shown, 17 images · non-contrast
Comparison: None.

CLINICAL DATA: Code stroke, confusion, right arm weakness, seizure-
like activity

CT HEAD WITHOUT CONTRAST
TECHNIQUE: Contiguous axial images were obtained from the base of
the skull through the vertex without contrast.

[Series 2: brain · axial · 0.47mm/px · z∈[+137,+277]mm · 13 of 36 slices shown, 17 images]
[im 3/36  brain]
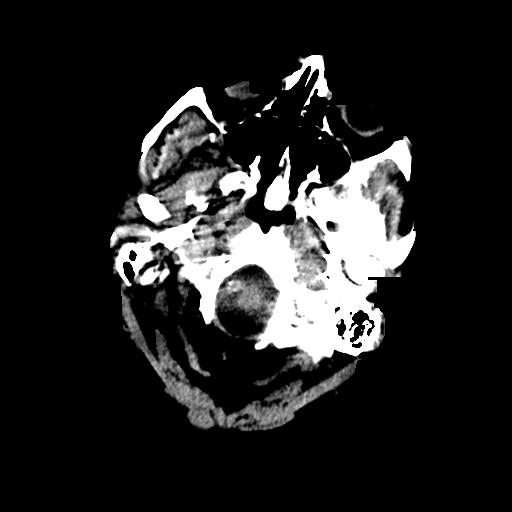
[im 3/36  bone]
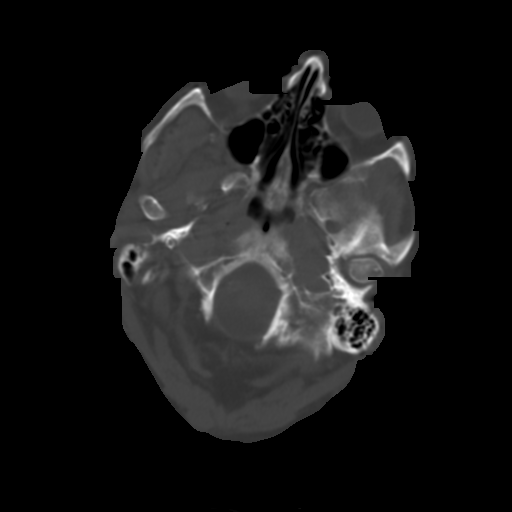
[im 6/36  brain]
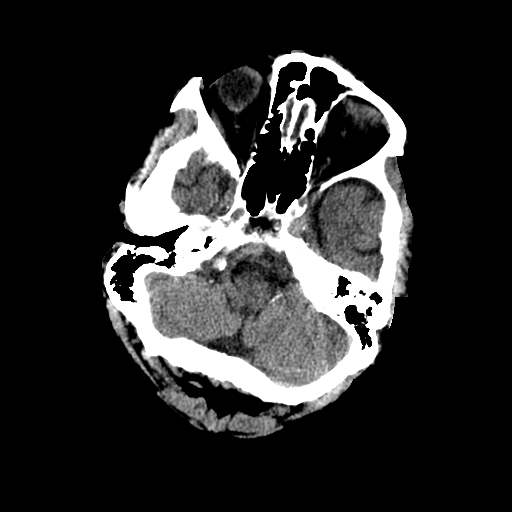
[im 8/36  brain]
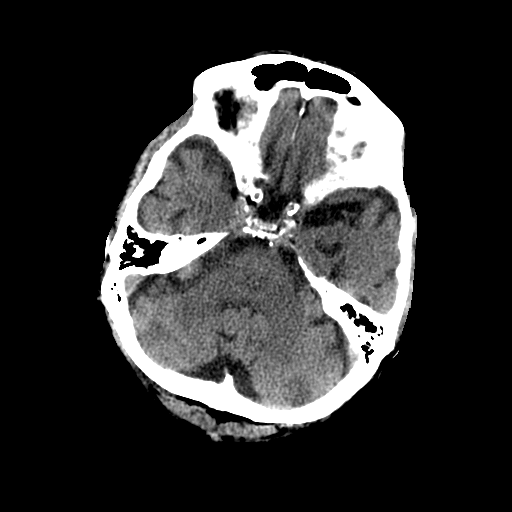
[im 11/36  brain]
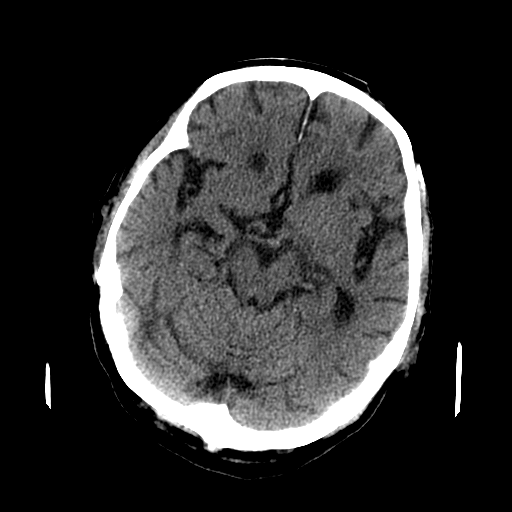
[im 13/36  brain]
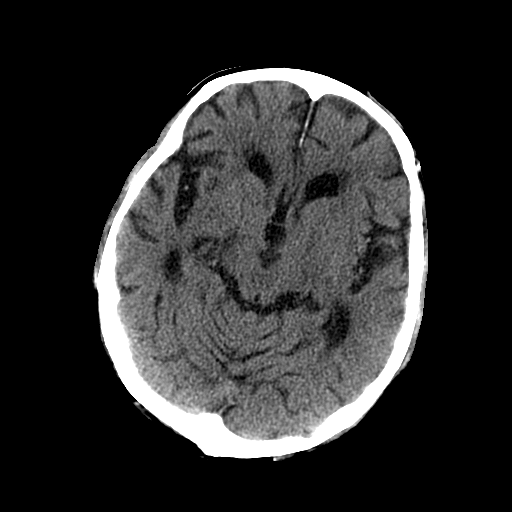
[im 13/36  bone]
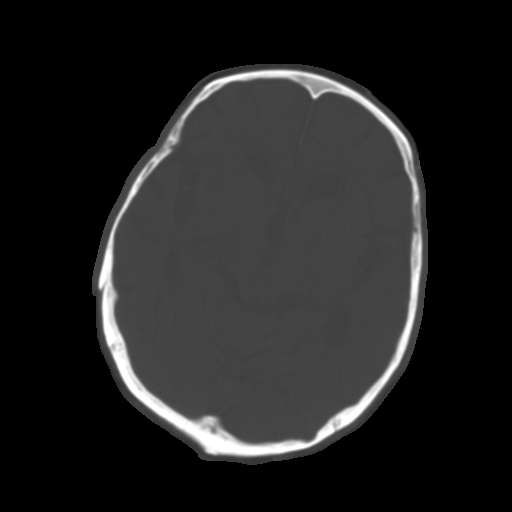
[im 16/36  brain]
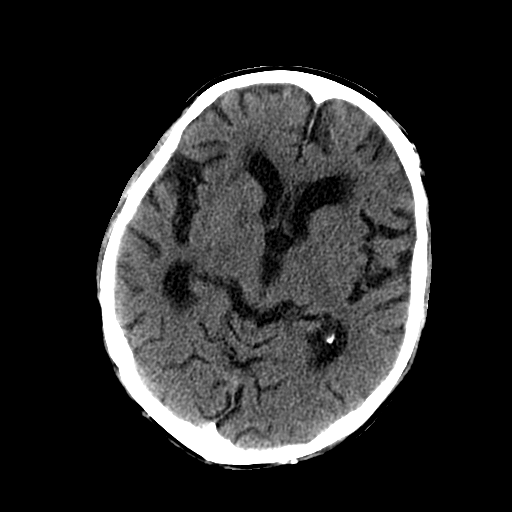
[im 18/36  brain]
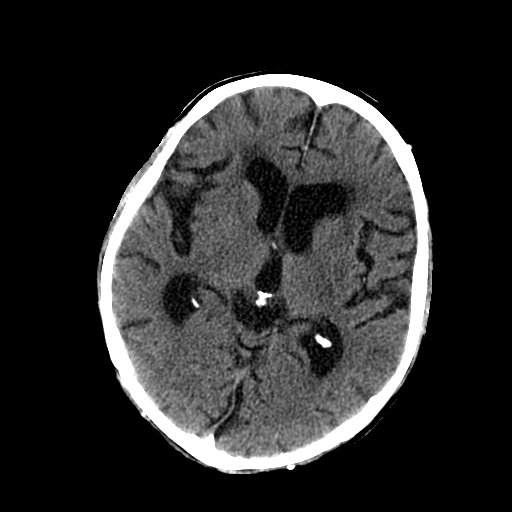
[im 21/36  brain]
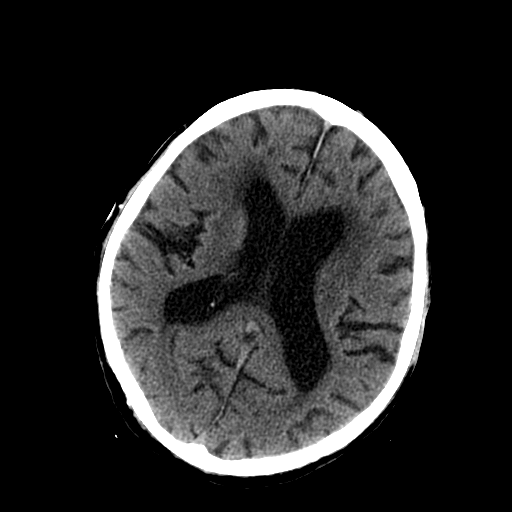
[im 23/36  brain]
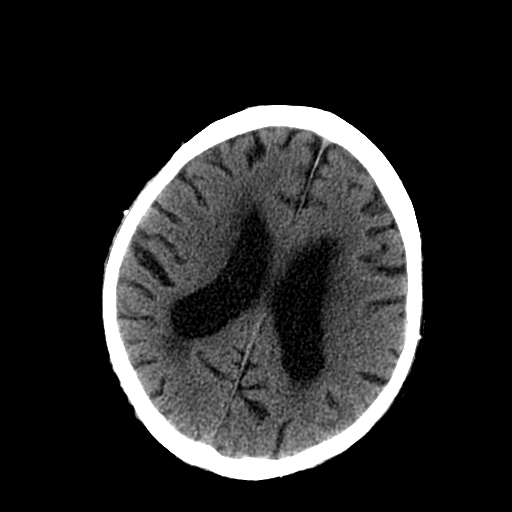
[im 23/36  bone]
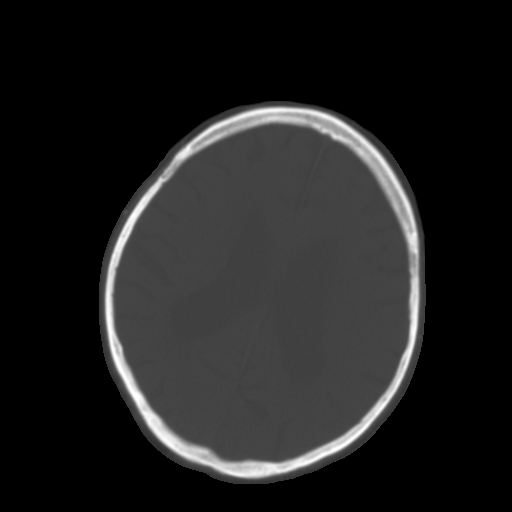
[im 26/36  brain]
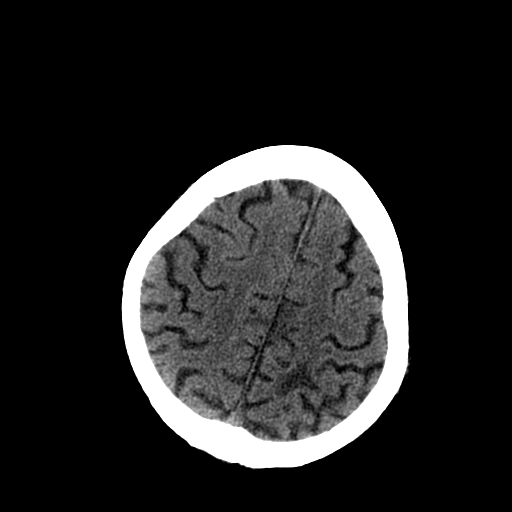
[im 28/36  brain]
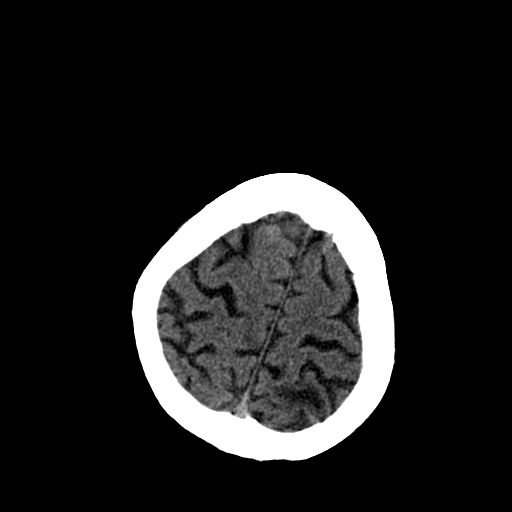
[im 31/36  brain]
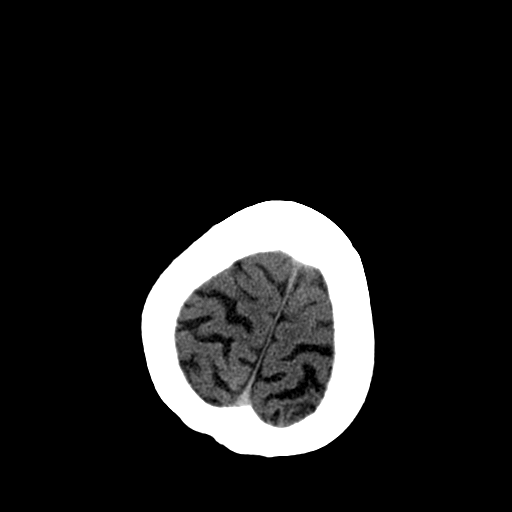
[im 33/36  brain]
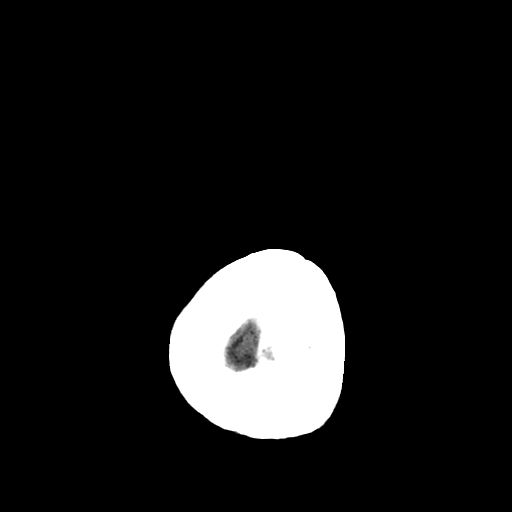
[im 33/36  bone]
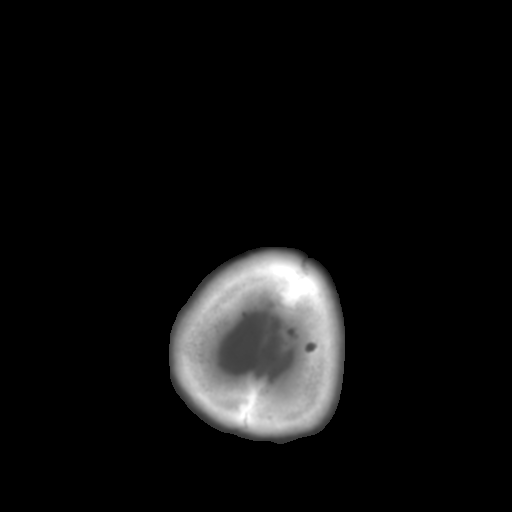

[13 of 30 positions shown; findings below may reference images not displayed]

FINDINGS: No evidence of parenchymal hemorrhage or extra-axial
fluid collection. No mass lesion, mass effect, or midline shift.

No CT evidence of acute infarction.

Encephalomalacic changes from prior infarct in the posterior left
frontal lobe.

Subcortical white matter and periventricular small vessel ischemic
changes. Intracranial atherosclerosis.

Atrophy with secondary ventricular prominence.

The visualized paranasal sinuses are essentially clear. The mastoid
air cells are unopacified.

No evidence of calvarial fracture.
IMPRESSION: No evidence of acute intracranial abnormality.

Old left posterior frontal infarct.

Atrophy with small vessel ischemic changes and intracranial
atherosclerosis.

## 2012-05-05 ENCOUNTER — Other Ambulatory Visit: Payer: Self-pay | Admitting: Cardiology

## 2012-05-06 ENCOUNTER — Encounter: Payer: Self-pay | Admitting: Neurology

## 2012-05-06 ENCOUNTER — Ambulatory Visit (INDEPENDENT_AMBULATORY_CARE_PROVIDER_SITE_OTHER): Payer: Medicare Other | Admitting: Neurology

## 2012-05-06 VITALS — BP 130/70 | HR 80 | Wt 148.0 lb

## 2012-05-06 DIAGNOSIS — R569 Unspecified convulsions: Secondary | ICD-10-CM

## 2012-05-06 NOTE — Progress Notes (Signed)
Dear Dr. Perrin Maltese,   I saw [place] back in [facility] for his problem with a probable seizure secondary to an old left parietal infarct . As you may recall, he is a 78 y.o. year old male with a history of atrial fibrillation who had a suspicious loss of consciousness in November of 2012. 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 He was started on Keppra 500 bid but I switched this to ZNS 200 qhs as he was having fatigue from the Keppra.   He has not had any further spells of LOC. It has been seven months since his probable seizure and he wonders if he can return to driving. He is otherwise tolerating the ZNS well.   He does wonder if the bruising on his arms is due to the zonisamide.  He has had no falls.  He continues to use his cane for walking.  His walking is impaired by hip problems and "one leg longer than the other".  Medical history, social history, and family history were reviewed and have not changed since the last clinic visit.  Current Outpatient Prescriptions on File Prior to Visit  Medication Sig Dispense Refill  . amiodarone (PACERONE) 200 MG tablet TAKE 1/2 TABLET DAILY  30 tablet  0  . aspirin 81 MG tablet Take 81 mg by mouth daily.        Marland Kitchen atorvastatin (LIPITOR) 10 MG tablet Take 1 tablet (10 mg total) by mouth daily.  30 tablet  6  . benazepril (LOTENSIN) 10 MG tablet Take 1 tablet (10 mg total) by mouth daily.  90 tablet  3  . bimatoprost (LUMIGAN) 0.03 % ophthalmic drops as directed.        . diltiazem (CARDIZEM) 120 MG tablet Take 1 tablet (120 mg total) by mouth daily.  90 tablet  3  . Multiple Vitamin (MULTIVITAMIN) tablet Take 1 tablet by mouth daily.        . nitroGLYCERIN (NITROSTAT) 0.4 MG SL tablet Place 0.4 mg under the tongue every 5 (five) minutes as needed.        Marland Kitchen spironolactone (ALDACTONE) 25 MG tablet 1 tab Mon,Wed, Friday  30 tablet  12  . zonisamide (ZONEGRAN) 100 MG capsule Take 2 capsules (200 mg total) by  mouth daily.  180 capsule  3    No Known Allergies  ROS:  13 systems were reviewed and are notable for bruising of the arms that "comes and goes"..  All other review of systems are unremarkable.  Exam: . Filed Vitals:   05/06/12 0940  BP: 130/70  Pulse: 80  Weight: 148 lb (67.132 kg)    In general, well appearing older man.  Mental status:   The patient is oriented to person, place and time. Recent and remote memory are intact. Attention span and concentration are normal. Language including repetition, naming, following commands are intact. Fund of knowledge of current and historical events, as well as vocabulary are normal.  Cranial Nerves: Pupils are equally round and reactive to light. Visual fields full to confrontation. Extraocular movements are intact without nystagmus. Facial sensation and muscles of mastication are intact. Muscles of facial expression are symmetric.  Tongue protrusion, uvula, palate midline.  Shoulder shrug intact  Motor:  Normal bulk and tone, no drift and 5/5 muscle strength bilaterally.  Reflexes:  2+ thoughout, absent at ankles. toes down.  Coordination:  Normal finger to nose  Gait:  Chronic wide based gait and station,  appears antalgic.  Can stand with his feet together.  Romberg negative.  Impression/Recommendations:  1.  Possible single seizure - In the setting of the parietal/occipital infarct I would continue his zonisamide as he is tolerating it.  Even if his ecchymosis is coming from the ZNS I would not favor changing it as he want to go back to driving.  I think it is ok for him to go back to driving as well.  His son is going to drive in the car with him. 2.  Gait instability - stable - no falls. will only work this up more aggressively if things change.   We will see the patient back in 3 months.  Lupita Raider Modesto Charon, MD [facility], [place]

## 2012-05-07 ENCOUNTER — Encounter: Payer: Self-pay | Admitting: Cardiology

## 2012-05-07 ENCOUNTER — Ambulatory Visit (INDEPENDENT_AMBULATORY_CARE_PROVIDER_SITE_OTHER): Payer: Medicare Other | Admitting: Cardiology

## 2012-05-07 VITALS — BP 148/82 | HR 71 | Ht 65.0 in | Wt 149.0 lb

## 2012-05-07 DIAGNOSIS — I4891 Unspecified atrial fibrillation: Secondary | ICD-10-CM

## 2012-05-07 DIAGNOSIS — R238 Other skin changes: Secondary | ICD-10-CM

## 2012-05-07 DIAGNOSIS — I5032 Chronic diastolic (congestive) heart failure: Secondary | ICD-10-CM

## 2012-05-07 LAB — CBC WITH DIFFERENTIAL/PLATELET
Basophils Absolute: 0 10*3/uL (ref 0.0–0.1)
HCT: 40.5 % (ref 39.0–52.0)
Hemoglobin: 13.3 g/dL (ref 13.0–17.0)
Lymphs Abs: 0.7 10*3/uL (ref 0.7–4.0)
MCHC: 33 g/dL (ref 30.0–36.0)
MCV: 97.3 fl (ref 78.0–100.0)
Monocytes Absolute: 0.6 10*3/uL (ref 0.1–1.0)
Monocytes Relative: 10.3 % (ref 3.0–12.0)
Neutro Abs: 4.7 10*3/uL (ref 1.4–7.7)
Platelets: 138 10*3/uL — ABNORMAL LOW (ref 150.0–400.0)
RDW: 14.7 % — ABNORMAL HIGH (ref 11.5–14.6)

## 2012-05-07 NOTE — Patient Instructions (Addendum)
Your physician recommends that you have lab today--CBC  Your physician wants you to follow-up in: 4 months with Dr Shirlee Latch. (October 2013).You will receive a reminder letter in the mail two months in advance. If you don't receive a letter, please call our office to schedule the follow-up appointment.

## 2012-05-09 DIAGNOSIS — R238 Other skin changes: Secondary | ICD-10-CM | POA: Insufficient documentation

## 2012-05-09 NOTE — Progress Notes (Signed)
Patient ID: [place], male   DOB: 22-Feb-1920, 78 y.o.   MRN: [id number] PCP: Dr. Perrin Maltese  78 yo with paroxysmal atrial fibrillation and diastolic CHF presents for followup.  He was hospitalized twice in 2/11 with atrial fibrillation and rapid ventricular response.  While in the hospital, he was diuresed for acute diastolic CHF.  He has been cardioverted twice now and remains in NSR today. He has been on amiodarone.  He is now living in an assisted living.  He was on Pradaxa in the past but this was stopped after an episode of significant GI bleeding, and he is on aspirin only at this time.  Mr Capell was hospitalized last fall with a presumed seizure.  Head CT showed an old left posterior frontal infarct but nothing acute.  The old CVA could have been a focus for a seizure.  He is now taking Zonegran.   Symptomatically doing well currently.  No tachypalpitations.   He walks his dog up to 6 times daily and rides his stationary bike for 10 minutes at a time.  No dyspnea walking on flat ground with mild dyspnea walking up a flight of steps.  No chest pain.   No falls.  He is steady on his feet with his cane.  Main limitation is hip and back pain.  No melena or hematochezia.  Weight is stable.  Lower extremity edema is unchanged.  He is planning to start driving again, was cleared by neurology.  He does report some easy bruising.   Labs (2/11): BNP 170, K 4.3, creatinine 1.1, LDL 77, HDL 70, TSH normal, LFTs normal Labs (3/11): K 4.3, creatinine 1.1 Labs (6/11): HCT 39.2 Labs (7/11): TSH normal, LFTs normal Labs (8/11): K 4.1, creatinine 1.1 Labs (12/11): K 4.9, creatinine 1.4, HCT 38.5, LFTs normal, TSH normal Labs (4/12): LFTs normal, LDL 86, HDL 77, BNP 206, K 4.6, creatinine 1.2 Labs (8/12): BNP 124 Labs (11/12): K 4.1, creatinine 0.86 Labs (12/12): TSH, LFTs normal Labs (3/13): K 4.5, creatinine 1.1, LDL 91, HDL 68, TSH/LFTs normal  ECG: NSR, right axis deviation  Allergies (verified):  No  Known Drug Allergies  Past Medical History: 1. Hypertension  2. Hyperlipidemia 3. History of nonsustained ventricular tachycardia in 2004 after wrist surgery. 4. History of prostate cancer, status post radioactive seeds and now on Lupron therapy. 5. Diastolic CHF: Echo (2/11) with EF 50-55%, mild left atrial enlargement, no significant valvular abnormalities. 6. Atrial fibrillation: Poorly tolerated, rapid response and develops CHF.  He is now on amiodarone.  PFTs (3/11) with mild obstructive defect, normal diffusion.  Pradaxa was stopped in [place] in 1/12 because of life-threatening GI bleed and we decided not to restart anticoagulation.  7. Elevated troponin in the setting of atrial fibrillation with RVR: Lexiscan myoview in 3/11 showed EF 76%, no ischemia or infarction.   8. GI bleed in the setting of colitis.  Pradaxa stopped (1/12).  9. CVA: old CVA seen on head CT 11/12.  Carotid dopplers (2/13) were normal.  10.  Seizure 11/12: prior CVA may have been focus for seizure activity.   Family History: Mother died at age 66.  Father died at age 29 due to  Parkinson disease.  There is no family history of premature coronary artery disease.  Social History: He lives in [place].  He he is a widower and retired. Denies any tobacco use.  He drinks alcohol socially. 2 sons, closest lives in [place] and the other lives in [place].  He is now in an assisted living facility.    Current Outpatient Prescriptions  Medication Sig Dispense Refill  . amiodarone (PACERONE) 200 MG tablet TAKE 1/2 TABLET DAILY  30 tablet  0  . aspirin 81 MG tablet Take 81 mg by mouth daily.        Marland Kitchen atorvastatin (LIPITOR) 10 MG tablet Take 1 tablet (10 mg total) by mouth daily.  30 tablet  6  . benazepril (LOTENSIN) 10 MG tablet Take 1 tablet (10 mg total) by mouth daily.  90 tablet  3  . bimatoprost (LUMIGAN) 0.03 % ophthalmic drops as directed.        . diltiazem (CARDIZEM) 120 MG tablet Take 1 tablet (120  mg total) by mouth daily.  90 tablet  3  . Multiple Vitamin (MULTIVITAMIN) tablet Take 1 tablet by mouth daily.        . nitroGLYCERIN (NITROSTAT) 0.4 MG SL tablet Place 0.4 mg under the tongue every 5 (five) minutes as needed.        Marland Kitchen spironolactone (ALDACTONE) 25 MG tablet 1 tab Mon,Wed, Friday  30 tablet  12  . zonisamide (ZONEGRAN) 100 MG capsule Take 2 capsules (200 mg total) by mouth daily.  180 capsule  3    BP 148/82  Pulse 71  Ht 5\' 5"  (1.651 m)  Wt 67.586 kg (149 lb)  BMI 24.79 kg/m2 General:  Well developed, well nourished, in no acute distress. Neck:  Neck supple, no JVD. No masses, thyromegaly or abnormal cervical nodes. Lungs:  Clear bilaterally to auscultation and percussion. Heart:  Non-displaced PMI, chest non-tender; regular rate and rhythm, S1, S2 without murmurs, rubs. +S4. Carotid upstroke normal, no bruit.  Pedals normal pulses. 1+ edema 1/2 to knees bilaterally.  Abdomen:  Bowel sounds positive; abdomen soft and non-tender without masses, organomegaly, or hernias noted. No hepatosplenomegaly. Extremities:  No clubbing or cyanosis. Neurologic:  Alert and oriented x 3. Psych:  Normal affect. Skin:  Ecchymoses on arms

## 2012-05-09 NOTE — Assessment & Plan Note (Signed)
He is only on aspirin 81, which I would like for him to continue.  Will check platelet level.

## 2012-05-09 NOTE — Assessment & Plan Note (Signed)
Patient is in NSR today.  He tolerates atrial fibrillation poorly.  I will have him continue amiodarone 100 mg daily as well as diltiazem CD.   - He will remain on ASA only for anticoagulation given severe GI bleed on Pradaxa.  He has had a prior history of CVA so unfortunately is at considerable risk for stroke off anticoagulation but considerable risk for GI bleed with anticoagulation. I think that the risk-benefit ratio falls towards aspirin only given his severe GI bleed last year.   - Recent LFTs/TSH on amiodarone were normal.  He follows up regularly with an eye doctor.

## 2012-05-09 NOTE — Assessment & Plan Note (Signed)
Minimal volume overload.  He is on spironolactone 3 times a week.  He is symptomatically stable (NYHA class II) with peripheral edema but no JVD.  I am not going to change his regimen today.

## 2012-05-12 ENCOUNTER — Telehealth: Payer: Self-pay | Admitting: Cardiology

## 2012-05-12 NOTE — Telephone Encounter (Signed)
Follow up on previous call:  Patient son [place] returning call back to nurse.

## 2012-05-12 NOTE — Telephone Encounter (Signed)
New msg Pt's son called about message that was left this morning. His father doesn't understand. Please call

## 2012-05-12 NOTE — Telephone Encounter (Signed)
Spoke with Loraine Leriche about pt's  recent CBC results.

## 2012-05-12 NOTE — Telephone Encounter (Signed)
LMTCB

## 2012-05-15 ENCOUNTER — Other Ambulatory Visit: Payer: Self-pay | Admitting: Cardiology

## 2012-05-15 DIAGNOSIS — I5032 Chronic diastolic (congestive) heart failure: Secondary | ICD-10-CM

## 2012-05-15 DIAGNOSIS — I4891 Unspecified atrial fibrillation: Secondary | ICD-10-CM

## 2012-05-15 MED ORDER — ATORVASTATIN CALCIUM 10 MG PO TABS: 10.0000 mg | ORAL_TABLET | Freq: Every day | ORAL | Status: DC

## 2012-05-19 ENCOUNTER — Other Ambulatory Visit: Payer: Self-pay | Admitting: *Deleted

## 2012-05-19 NOTE — Telephone Encounter (Signed)
Opened in Error.

## 2012-06-04 ENCOUNTER — Other Ambulatory Visit: Payer: Self-pay | Admitting: Cardiology

## 2012-06-20 ENCOUNTER — Telehealth: Payer: Self-pay | Admitting: Cardiology

## 2012-06-20 MED ORDER — BENAZEPRIL HCL 10 MG PO TABS: 10.0000 mg | ORAL_TABLET | Freq: Every day | ORAL | Status: DC

## 2012-06-20 NOTE — Telephone Encounter (Signed)
Spoke with pt's son. The prescriptions have sent into mail order and local pharmacy.

## 2012-06-20 NOTE — Telephone Encounter (Signed)
Pt's mark calling re rx denied twice for benazepril , is he to continue ? uses mail order [facility] and last pill will be Sunday, pt 78 yrs old and will have trouble getting a local rx, but if he is to continue on this med will have to have a 2 weeks supply called [facility], then 90 days to [facility], pls call son mark to let him know

## 2012-07-28 ENCOUNTER — Encounter: Payer: Self-pay | Admitting: Neurology

## 2012-07-28 ENCOUNTER — Ambulatory Visit (INDEPENDENT_AMBULATORY_CARE_PROVIDER_SITE_OTHER): Payer: Medicare Other | Admitting: Neurology

## 2012-07-28 VITALS — BP 140/78 | HR 60 | Wt 150.0 lb

## 2012-07-28 DIAGNOSIS — R569 Unspecified convulsions: Secondary | ICD-10-CM

## 2012-07-28 MED ORDER — ZONISAMIDE 100 MG PO CAPS: 200.0000 mg | ORAL_CAPSULE | Freq: Every day | ORAL | Status: AC

## 2012-07-28 NOTE — Progress Notes (Signed)
Dear Dr. Perrin Maltese,   I saw [place] back in [facility] for his problem with a probable seizure secondary to an old left parietal infarct . As you may recall, he is a 78 y.o. year old male with a history of atrial fibrillation who had a suspicious loss of consciousness in November of 2012. 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 He was started on Keppra 500 bid but I switched this to ZNS 200 qhs as he was having fatigue from the Keppra.   He continues to do well.  No seizures, no side effects.  He has had no falls. He continues to use his cane for walking. His walking is impaired by hip problems and "one leg longer than the other".  He is back to driving about once per week.  Medical history, social history, and family history were reviewed and have not changed since the last clinic visit.  Current Outpatient Prescriptions on File Prior to Visit  Medication Sig Dispense Refill  . amiodarone (PACERONE) 200 MG tablet TAKE 1/2 TABLET DAILY  30 tablet  2  . aspirin 81 MG tablet Take 81 mg by mouth daily.        Marland Kitchen atorvastatin (LIPITOR) 10 MG tablet Take 1 tablet (10 mg total) by mouth daily.  30 tablet  6  . benazepril (LOTENSIN) 10 MG tablet Take 1 tablet (10 mg total) by mouth daily.  30 tablet  6  . bimatoprost (LUMIGAN) 0.03 % ophthalmic drops as directed.        . diltiazem (CARDIZEM) 120 MG tablet Take 1 tablet (120 mg total) by mouth daily.  90 tablet  3  . Multiple Vitamin (MULTIVITAMIN) tablet Take 1 tablet by mouth daily.        . nitroGLYCERIN (NITROSTAT) 0.4 MG SL tablet Place 0.4 mg under the tongue every 5 (five) minutes as needed.        Marland Kitchen spironolactone (ALDACTONE) 25 MG tablet 1 tab Mon,Wed, Friday  30 tablet  12  . DISCONTD: zonisamide (ZONEGRAN) 100 MG capsule Take 2 capsules (200 mg total) by mouth daily.  180 capsule  3    No Known Allergies  ROS:  13 systems were reviewed and are notable for chronic gait instability.  All other  review of systems are unremarkable.  Exam: . Filed Vitals:   07/28/12 0954  BP: 140/78  Pulse: 60  Weight: 150 lb (68.04 kg)    In general, well appearing older man.   Cranial Nerves: Pupils are equally round and reactive to light. Visual fields full to confrontation.EOMs fine end gaze nystagmus. . Facial sensation and muscles of mastication are intact. Muscles of facial expression are symmetric. Hearing decreased to bilateral finger rub. Tongue protrusion, uvula, palate midline.  Shoulder shrug intact  Motor:  Normal bulk and tone, no drift and 5/5 muscle strength bilaterally.    Coordination:  Normal finger to nose  Gait:  ++ Antalgic gait.  Impression/Recommendations:  1. probable single seizure secondary to old ischemic infarcts. - continue ZNS 200 qhs.  I don't see any reason to stop this in the future.  He can return to your care.  If he has another seizure, he can be followed up by Dr. Arbutus Leas my colleague or can see me at [facility].  Lupita Raider Modesto Charon, MD [facility], [place]

## 2012-07-30 ENCOUNTER — Ambulatory Visit (INDEPENDENT_AMBULATORY_CARE_PROVIDER_SITE_OTHER): Payer: Medicare Other | Admitting: Cardiology

## 2012-07-30 ENCOUNTER — Encounter: Payer: Self-pay | Admitting: Cardiology

## 2012-07-30 VITALS — BP 130/70 | HR 72 | Ht 65.0 in | Wt 147.0 lb

## 2012-07-30 DIAGNOSIS — E785 Hyperlipidemia, unspecified: Secondary | ICD-10-CM

## 2012-07-30 DIAGNOSIS — I5032 Chronic diastolic (congestive) heart failure: Secondary | ICD-10-CM

## 2012-07-30 DIAGNOSIS — I4891 Unspecified atrial fibrillation: Secondary | ICD-10-CM

## 2012-07-30 MED ORDER — ATORVASTATIN CALCIUM 10 MG PO TABS: 10.0000 mg | ORAL_TABLET | Freq: Every day | ORAL | Status: DC

## 2012-07-30 MED ORDER — DILTIAZEM HCL 120 MG PO TABS: 120.0000 mg | ORAL_TABLET | Freq: Every day | ORAL | Status: DC

## 2012-07-30 MED ORDER — NITROGLYCERIN 0.4 MG SL SUBL: 0.4000 mg | SUBLINGUAL_TABLET | SUBLINGUAL | Status: DC | PRN

## 2012-07-30 MED ORDER — SPIRONOLACTONE 25 MG PO TABS: ORAL_TABLET | ORAL | Status: DC

## 2012-07-30 MED ORDER — AMIODARONE HCL 200 MG PO TABS: ORAL_TABLET | ORAL | Status: DC

## 2012-07-30 MED ORDER — BENAZEPRIL HCL 10 MG PO TABS: 10.0000 mg | ORAL_TABLET | Freq: Every day | ORAL | Status: DC

## 2012-07-30 NOTE — Patient Instructions (Addendum)
Your physician recommends that you return for a FASTING lipid profile /liver profile/BMET/TSH 428.31  428.32. This is scheduled for Friday September 13,2013 any time after 8:30am. Do not eat or drink after midnight Thursday night.    Your physician wants you to follow-up in: 6 months with Dr Shirlee Latch. (March 2014). You will receive a reminder letter in the mail two months in advance. If you don't receive a letter, please call our office to schedule the follow-up appointment.

## 2012-07-31 NOTE — Progress Notes (Signed)
Patient ID: Henry Meyers, male   DOB: 19-Jan-1920, 78 y.o.   MRN: [id number] PCP: Dr. Perrin Maltese  78 yo with paroxysmal atrial fibrillation and diastolic CHF presents for followup.  He was hospitalized twice in 2/11 with atrial fibrillation and rapid ventricular response.  While in the hospital, he was diuresed for acute diastolic CHF.  He has been cardioverted twice now and remains in NSR today. He has been on amiodarone.  He is now living in an assisted living.  He was on Pradaxa in the past but this was stopped after an episode of significant GI bleeding, and he is on aspirin only at this time.  Henry Meyers was hospitalized last fall with a presumed seizure.  Head CT showed an old left posterior frontal infarct but nothing acute.  The old CVA could have been a focus for a seizure.  He is now taking Zonegran.   Symptomatically doing well currently.  No tachypalpitations.   He walks his dog up to 6 times daily and rides his stationary bike for 10 minutes at a time.  No dyspnea walking on flat ground with mild dyspnea walking up a flight of steps.  No chest pain.   No falls.  He is steady on his feet with his cane.  Main limitation is hip and back pain.  No melena or hematochezia.  Weight is down 2 lbs.  Lower extremity edema is improved.  He has started driving again.   Labs (2/11): BNP 170, K 4.3, creatinine 1.1, LDL 77, HDL 70, TSH normal, LFTs normal Labs (3/11): K 4.3, creatinine 1.1 Labs (6/11): HCT 39.2 Labs (7/11): TSH normal, LFTs normal Labs (8/11): K 4.1, creatinine 1.1 Labs (12/11): K 4.9, creatinine 1.4, HCT 38.5, LFTs normal, TSH normal Labs (4/12): LFTs normal, LDL 86, HDL 77, BNP 206, K 4.6, creatinine 1.2 Labs (8/12): BNP 124 Labs (11/12): K 4.1, creatinine 0.86 Labs (12/12): TSH, LFTs normal Labs (3/13): K 4.5, creatinine 1.1, LDL 91, HDL 68, TSH/LFTs normal Labs (6/13): HCT 40.5, plts 138  ECG: NSR, right axis deviation  Allergies (verified):  No Known Drug Allergies  Past  Medical History: 1. Hypertension  2. Hyperlipidemia 3. History of nonsustained ventricular tachycardia in 2004 after wrist surgery. 4. History of prostate cancer, status post radioactive seeds and now on Lupron therapy. 5. Diastolic CHF: Echo (2/11) with EF 50-55%, mild left atrial enlargement, no significant valvular abnormalities. 6. Atrial fibrillation: Poorly tolerated, rapid response and develops CHF.  He is now on amiodarone.  PFTs (3/11) with mild obstructive defect, normal diffusion.  Pradaxa was stopped in [place] in 1/12 because of life-threatening GI bleed and we decided not to restart anticoagulation.  7. Elevated troponin in the setting of atrial fibrillation with RVR: Lexiscan myoview in 3/11 showed EF 76%, no ischemia or infarction.   8. GI bleed in the setting of colitis.  Pradaxa stopped (1/12).  9. CVA: old CVA seen on head CT 11/12.  Carotid dopplers (2/13) were normal.  10.  Seizure 11/12: prior CVA may have been focus for seizure activity.  11. Thrombocytopenia: mild.   Family History: Mother died at age 68.  Father died at age 10 due to  Parkinson disease.  There is no family history of premature coronary artery disease.  Social History: He lives in [place].  He he is a widower and retired. Denies any tobacco use.  He drinks alcohol socially. 2 sons, closest lives in [place] and the other lives in [place].  He is now in an assisted living facility.    Current Outpatient Prescriptions  Medication Sig Dispense Refill  . amiodarone (PACERONE) 200 MG tablet 1/2 tablet daily  45 tablet  3  . aspirin 81 MG tablet Take 81 mg by mouth daily.        Marland Kitchen atorvastatin (LIPITOR) 10 MG tablet Take 1 tablet (10 mg total) by mouth daily.  90 tablet  3  . benazepril (LOTENSIN) 10 MG tablet Take 1 tablet (10 mg total) by mouth daily.  90 tablet  3  . bimatoprost (LUMIGAN) 0.03 % ophthalmic drops as directed.        . diltiazem (CARDIZEM) 120 MG tablet Take 1 tablet (120 mg  total) by mouth daily.  90 tablet  3  . Multiple Vitamin (MULTIVITAMIN) tablet Take 1 tablet by mouth daily.        . nitroGLYCERIN (NITROSTAT) 0.4 MG SL tablet Place 1 tablet (0.4 mg total) under the tongue every 5 (five) minutes as needed.  100 tablet  3  . spironolactone (ALDACTONE) 25 MG tablet 1 tab Mon,Wed, Friday  90 tablet  3  . zonisamide (ZONEGRAN) 100 MG capsule Take 2 capsules (200 mg total) by mouth daily.  180 capsule  3    BP 130/70  Pulse 72  Ht 5\' 5"  (1.651 m)  Wt 147 lb (66.679 kg)  BMI 24.46 kg/m2 General:  Well developed, well nourished, in no acute distress. Neck:  Neck supple, no JVD. No masses, thyromegaly or abnormal cervical nodes. Lungs:  Clear bilaterally to auscultation and percussion. Heart:  Non-displaced PMI, chest non-tender; regular rate and rhythm, S1, S2 without murmurs, rubs. +S4. Carotid upstroke normal, no bruit.  Pedals normal pulses. 1+ ankle edema bilaterally.  Abdomen:  Bowel sounds positive; abdomen soft and non-tender without masses, organomegaly, or hernias noted. No hepatosplenomegaly. Extremities:  No clubbing or cyanosis. Neurologic:  Alert and oriented x 3. Psych:  Normal affect. Skin:  Ecchymoses on arms  Assessment/Plan  1. ATRIAL FIBRILLATION  Patient is in NSR today. He tolerates atrial fibrillation poorly. I will have him continue amiodarone 100 mg daily as well as diltiazem CD.  - He will remain on ASA only for anticoagulation given severe GI bleed on Pradaxa. He has had a prior history of CVA so unfortunately is at considerable risk for stroke off anticoagulation but considerable risk for GI bleed with anticoagulation. I think that the risk-benefit ratio falls towards aspirin only given his severe GI bleed last year.  - Check LFTs/TSH today. He follows up regularly with an eye doctor.  2. DIASTOLIC HEART FAILURE, CHRONIC  No significant degree of volume overload. He is on spironolactone 3 times a week. He is symptomatically stable  (NYHA class II) with mild peripheral edema but no JVD. I am not going to change his regimen today.  I will get a BMET given spironolactone use.  3. Hyperlipidemia I will check lipids.  Talah Cookston [employer]

## 2012-08-01 ENCOUNTER — Other Ambulatory Visit (INDEPENDENT_AMBULATORY_CARE_PROVIDER_SITE_OTHER): Payer: Medicare Other

## 2012-08-01 ENCOUNTER — Ambulatory Visit (INDEPENDENT_AMBULATORY_CARE_PROVIDER_SITE_OTHER): Payer: Medicare Other | Admitting: *Deleted

## 2012-08-01 ENCOUNTER — Other Ambulatory Visit: Payer: Self-pay | Admitting: *Deleted

## 2012-08-01 VITALS — BP 134/74 | HR 65

## 2012-08-01 DIAGNOSIS — I5032 Chronic diastolic (congestive) heart failure: Secondary | ICD-10-CM

## 2012-08-01 DIAGNOSIS — I4891 Unspecified atrial fibrillation: Secondary | ICD-10-CM

## 2012-08-01 DIAGNOSIS — R0789 Other chest pain: Secondary | ICD-10-CM

## 2012-08-01 DIAGNOSIS — R079 Chest pain, unspecified: Secondary | ICD-10-CM

## 2012-08-01 LAB — CBC WITH DIFFERENTIAL/PLATELET
Basophils Absolute: 0 10*3/uL (ref 0.0–0.1)
Eosinophils Absolute: 0 10*3/uL (ref 0.0–0.7)
HCT: 39.5 % (ref 39.0–52.0)
Hemoglobin: 12.8 g/dL — ABNORMAL LOW (ref 13.0–17.0)
Lymphs Abs: 0.7 10*3/uL (ref 0.7–4.0)
MCHC: 32.3 g/dL (ref 30.0–36.0)
Neutro Abs: 8.4 10*3/uL — ABNORMAL HIGH (ref 1.4–7.7)
RDW: 14.1 % (ref 11.5–14.6)

## 2012-08-01 LAB — BASIC METABOLIC PANEL
BUN: 25 mg/dL — ABNORMAL HIGH (ref 6–23)
CO2: 21 mEq/L (ref 19–32)
Calcium: 9 mg/dL (ref 8.4–10.5)
Glucose, Bld: 95 mg/dL (ref 70–99)
Potassium: 4.3 mEq/L (ref 3.5–5.1)
Sodium: 136 mEq/L (ref 135–145)

## 2012-08-01 LAB — LIPID PANEL
Cholesterol: 172 mg/dL (ref 0–200)
Triglycerides: 64 mg/dL (ref 0.0–149.0)
VLDL: 12.8 mg/dL (ref 0.0–40.0)

## 2012-08-01 LAB — HEPATIC FUNCTION PANEL
AST: 20 U/L (ref 0–37)
Albumin: 3.7 g/dL (ref 3.5–5.2)

## 2012-08-01 NOTE — Progress Notes (Signed)
I was called to check patient after he had his lab draw today. Pt was sitting in the lobby after lab draw and complained to his son about discomfort in his chest. I took pt to an exam room and performed an EKG. Pt states he woke up this morning with indigestion. Pt states the discomfort was a 2 out of 10 this morning.   His son, with him today, states when pt told him he was having chest discomfort he was rubbing his upper chest area. When I asked the patient where he was having discomfort he rubbed his abdominal area.  An EKG was done and reviewed by Dr Riley Kill. The EKG was normal. Dr Riley Kill saw patient today. He reviewed options with pt and his son. He recommended getting a CBC today and scheduling pt an appt to see Dr Shirlee Latch back on Monday. Pt was discomfort/pain free when he left the office today.

## 2012-08-01 NOTE — Progress Notes (Signed)
Patient seen with Ms Sharren Bridge.  We interviewed the patient and the son.  He has had some epigastric discomfort.  He has had a GI bleed requiring dc of Pradaxa.  He did not get sweaty or short of breath, and feels better now.  We discussed possible admission, however, but the goals of doing this were unclear in the absence of further symptoms.  He has also had prior stroke.  ECG and exam were unremarkable. We will do labs and have him see Dr. Shirlee Latch on Monday.  If he is having any more symptoms, then he is to come to the ER for admission.  I spent twenty minutes with the patient with no charge.

## 2012-08-04 ENCOUNTER — Ambulatory Visit: Payer: Medicare Other | Admitting: Neurology

## 2012-08-04 ENCOUNTER — Ambulatory Visit: Payer: Medicare Other | Admitting: Cardiology

## 2012-08-04 ENCOUNTER — Telehealth: Payer: Self-pay | Admitting: Cardiology

## 2012-08-04 ENCOUNTER — Other Ambulatory Visit: Payer: Self-pay | Admitting: Cardiology

## 2012-08-04 MED ORDER — DILTIAZEM HCL ER COATED BEADS 120 MG PO CP24: 120.0000 mg | ORAL_CAPSULE | Freq: Every day | ORAL | Status: DC

## 2012-08-04 NOTE — Telephone Encounter (Signed)
New problem:  Wants to cancel appt for today. Would like for you to call him.

## 2012-08-04 NOTE — Telephone Encounter (Signed)
[facility] called and stated the refill sent for the pt, his diltiazem was changed from 24 hr capsule to a 12 hr tablet wanted clarification the also wanted to know if Dr. Shirlee Latch was aware the pt was also taking atorvastatin along with the diltiazem.  Caralee Ates, CMA   Spoke with Dr. Shirlee Latch he verified that the pt was supposed to be taking diltiazem (CARDIZEM SL) 120 MG qd, along with taking the atorvastatin.  Caralee Ates, CMA    Spoke with RIMA [facility]ist[facility] informing her I had spoke with Dr. Shirlee Latch, fixing the diltiazem (CARDIZEM SL) 120 MG qd and letting her know he was aware the pt was also taking atorvastatin.  Caralee Ates, CMA

## 2012-08-04 NOTE — Telephone Encounter (Signed)
LM VM

## 2012-08-04 NOTE — Telephone Encounter (Signed)
Spoke with pt. Pt states he is feeling great today. Pt states after he ate lunch on Friday he felt "normal". He did not have any more pressure in his chest or abdomen. He went out shopping over the weekend without any problems. He cancelled his appt today with Dr Shirlee Latch. He will call back if he has any more symptoms.

## 2012-08-04 NOTE — Telephone Encounter (Signed)
ok 

## 2012-08-05 ENCOUNTER — Other Ambulatory Visit: Payer: Self-pay | Admitting: Cardiology

## 2012-08-07 ENCOUNTER — Ambulatory Visit: Payer: Medicare Other | Admitting: Cardiology

## 2012-08-07 NOTE — Telephone Encounter (Signed)
Refilled diltiazem 

## 2012-08-11 ENCOUNTER — Telehealth: Payer: Self-pay | Admitting: Cardiology

## 2012-08-11 NOTE — Telephone Encounter (Signed)
Pt requesting call from [facility] tomorrow, said she will know what its about

## 2012-08-12 NOTE — Telephone Encounter (Signed)
Spoke with pt. Pt will come for BMET 08/15/12. Pt not having any symptoms at current time. He will see Dr Shirlee Latch in March 2014 unless any changes.

## 2012-08-15 ENCOUNTER — Telehealth: Payer: Self-pay | Admitting: *Deleted

## 2012-08-15 ENCOUNTER — Other Ambulatory Visit (INDEPENDENT_AMBULATORY_CARE_PROVIDER_SITE_OTHER): Payer: Medicare Other

## 2012-08-15 DIAGNOSIS — R079 Chest pain, unspecified: Secondary | ICD-10-CM

## 2012-08-15 DIAGNOSIS — I4891 Unspecified atrial fibrillation: Secondary | ICD-10-CM

## 2012-08-15 LAB — BASIC METABOLIC PANEL
Calcium: 8.9 mg/dL (ref 8.4–10.5)
Creatinine, Ser: 1.6 mg/dL — ABNORMAL HIGH (ref 0.4–1.5)
GFR: 43.99 mL/min — ABNORMAL LOW (ref >60.00)

## 2012-08-15 NOTE — Telephone Encounter (Signed)
SPOKE WITH PT THIS AM   PT HERE FOR LABS AND WHILE HERE  WANTED TO LET DR [facility] AND [facility] KNOW HE WAS  STARTED ON  NEW MED  BACK IN July  BY DR DAHLSTEDT  BICALUTAMIDE 50 MG  HAD CONCERNS MAY  BOTHER LIVER  INFORMED PT LIVER WAS OKAY  A COUPLE WEEKS AGO   AND  ONLY  HAD BMET DONE TODAY  NOW  HAS CONCERNS RE KIDNEY FUNCTIONS INFORMED WILL CALL ONCE  DR [facility] REVIEWS LABS FROM TODAY ./CY

## 2012-08-18 NOTE — Telephone Encounter (Signed)
Spoke with pt

## 2012-08-18 NOTE — Telephone Encounter (Signed)
Dr Shirlee Latch reviewed BMET done 08/15/12. No med changes, stay hydrated. LMTCB for pt

## 2012-09-01 ENCOUNTER — Telehealth: Payer: Self-pay

## 2012-09-01 DIAGNOSIS — Z139 Encounter for screening, unspecified: Secondary | ICD-10-CM

## 2012-09-01 NOTE — Telephone Encounter (Signed)
The patient called to request referral paper work be sent to Dr. Marciano Sequin.  The patient has an appointment for a hearing test with Dr. Marciano Sequin on 09/18/12 and needs Dr. Perrin Maltese to state that the patient does need a hearing test.  Please call the patient at [phone] with any questions.

## 2012-09-03 NOTE — Telephone Encounter (Signed)
Sure we can put in referral. Please put in under Dr. Ernestene Mention name.

## 2012-09-03 NOTE — Telephone Encounter (Signed)
Please advise, Dr Perrin Maltese did see him and do his physical in March. There is no mention of hearing difficulty, but he is our patient, can we put in order for referral to Dr Marciano Sequin

## 2012-09-04 NOTE — Telephone Encounter (Signed)
Called patient to advise. Order put in for him.

## 2012-09-18 ENCOUNTER — Telehealth: Payer: Self-pay

## 2012-09-18 NOTE — Telephone Encounter (Signed)
Pt's son brought by an application for a handicapped parking placard for Dr Perrin Maltese to complete and sign. I have put form in Dr Ernestene Mention box

## 2012-09-22 NOTE — Telephone Encounter (Signed)
Dr Perrin Maltese, have you completed this for pt? If so, you may close this encounter. If we still need to do something else pertaining to this please let me know.

## 2012-10-20 ENCOUNTER — Ambulatory Visit: Payer: Medicare Other | Admitting: Internal Medicine

## 2012-11-13 ENCOUNTER — Telehealth: Payer: Self-pay

## 2012-11-13 NOTE — Telephone Encounter (Signed)
pts son mark Dreibelbis called states his father is a resident at [facility], and is sick with joint and muscle aches. Son lives in [place] and needs to talk with someone about his condition

## 2012-11-13 NOTE — Telephone Encounter (Signed)
I have advised his son, we are seeing a lot of the flu. He is advised to come in for this if he can. Son advised he thinks he is okay, but he will let him know it may be the flu. I have encouraged fluids, rest and tylenol or advil.

## 2012-12-05 ENCOUNTER — Telehealth: Payer: Self-pay | Admitting: Oncology

## 2012-12-05 NOTE — Telephone Encounter (Signed)
S/W pt son will call back on Monday to schedule appt.

## 2012-12-08 ENCOUNTER — Telehealth: Payer: Self-pay | Admitting: Oncology

## 2012-12-08 NOTE — Telephone Encounter (Signed)
C/D 12/08/12 for appt. 01/07/13

## 2012-12-08 NOTE — Telephone Encounter (Signed)
S/W pt son in re to NP appt 02/19 @ 1:30 w/Dr. Clelia Croft.  Son requested date.  Referring Dr. Retta Diones Dx-Prostate CA Welcome packet mailed.

## 2012-12-23 ENCOUNTER — Telehealth: Payer: Self-pay | Admitting: Cardiology

## 2012-12-23 NOTE — Telephone Encounter (Signed)
Spoke with pt who reports he had eye surgery a couple of weeks ago and blood pressure has been running high since then. He reports high of 180/90. Last night blood pressure was checked by paramedics and it was 158/90. He is having no other complaints.  I did not see any openings with Dr. Shirlee Latch for week of 2/16. I offered to make appt with NP or PA or send to [facility] to look at Dr. Alford Highland schedule. He would like to see Dr. Shirlee Latch.

## 2012-12-23 NOTE — Telephone Encounter (Signed)
New Problem:    Patient called in wanting to be seen on the week of 01/04/13 because his son will be in town then.  He is having issues with his BP.  Please call back.

## 2012-12-23 NOTE — Telephone Encounter (Signed)
He can be double booked in to see me asap.

## 2012-12-24 NOTE — Telephone Encounter (Signed)
LMTCB

## 2012-12-24 NOTE — Telephone Encounter (Signed)
Spoke with pt. Pt states he had his BP checked by nurse where he lives. It was 120/70.  He is requesting an appt with Dr Shirlee Latch the week of 01/12/13 because his son will be in town.  I have given him an appt to see Dr Shirlee Latch 01/13/13.

## 2012-12-24 NOTE — Telephone Encounter (Signed)
Pt called back, BP is fine now, pt doesn't need a call back

## 2013-01-01 ENCOUNTER — Other Ambulatory Visit: Payer: Self-pay | Admitting: Oncology

## 2013-01-01 DIAGNOSIS — C61 Malignant neoplasm of prostate: Secondary | ICD-10-CM

## 2013-01-07 ENCOUNTER — Encounter: Payer: Self-pay | Admitting: Oncology

## 2013-01-07 ENCOUNTER — Ambulatory Visit: Payer: Medicare Other

## 2013-01-07 ENCOUNTER — Ambulatory Visit (HOSPITAL_BASED_OUTPATIENT_CLINIC_OR_DEPARTMENT_OTHER): Payer: Medicare Other | Admitting: Oncology

## 2013-01-07 ENCOUNTER — Other Ambulatory Visit: Payer: Medicare Other

## 2013-01-07 VITALS — BP 187/75 | HR 67 | Temp 97.3°F | Resp 20 | Ht 64.0 in | Wt 147.7 lb

## 2013-01-07 DIAGNOSIS — C61 Malignant neoplasm of prostate: Secondary | ICD-10-CM

## 2013-01-07 DIAGNOSIS — M810 Age-related osteoporosis without current pathological fracture: Secondary | ICD-10-CM

## 2013-01-07 LAB — CBC WITH DIFFERENTIAL/PLATELET
Eosinophils Absolute: 0 10*3/uL (ref 0.0–0.5)
MCV: 96.1 fL (ref 79.3–98.0)
MONO#: 0.8 10*3/uL (ref 0.1–0.9)
MONO%: 11.8 % (ref 0.0–14.0)
NEUT#: 4.7 10*3/uL (ref 1.5–6.5)
RBC: 4.12 10*6/uL — ABNORMAL LOW (ref 4.20–5.82)
RDW: 14.8 % — ABNORMAL HIGH (ref 11.0–14.6)
WBC: 6.4 10*3/uL (ref 4.0–10.3)
lymph#: 0.9 10*3/uL (ref 0.9–3.3)

## 2013-01-07 LAB — COMPREHENSIVE METABOLIC PANEL (CC13)
AST: 18 U/L (ref 5–34)
Alkaline Phosphatase: 98 U/L (ref 40–150)
BUN: 25.8 mg/dL (ref 7.0–26.0)
Creatinine: 1.5 mg/dL — ABNORMAL HIGH (ref 0.7–1.3)
Glucose: 98 mg/dl (ref 70–99)
Potassium: 4.2 mEq/L (ref 3.5–5.1)
Total Bilirubin: 0.42 mg/dL (ref 0.20–1.20)

## 2013-01-07 LAB — TESTOSTERONE: Testosterone: <10 ng/dL — ABNORMAL LOW (ref 300–890)

## 2013-01-07 NOTE — Progress Notes (Signed)
Checked in new pt with no financial concerns. °

## 2013-01-07 NOTE — Progress Notes (Signed)
Note dictated

## 2013-01-08 NOTE — Progress Notes (Signed)
CC:   Henry Meyers, M.D. Bertram Millard. Dahlstedt, M.D.  REASON FOR CONSULTATION:  Prostate cancer.  HISTORY OF PRESENT ILLNESS:  This is a pleasant 79 year old gentleman currently of [place], lived in multiple locations as a part of his occupation working as an AT and Medical illustrator.  He is currently retired and resides in [facility] senior living facility.  He gets his routine medical care under the care of Dr. Perrin Maltese, but currently been gaiting predominantly his care by Dr. Redmond School at [facility].  He also sees Dr. Retta Diones for his prostate cancer.  His prostate cancer dates back to 6.  At that time, he had presented with a routine checkup and a PSA of 5.  A biopsy revealed a Gleason score of 7 and a diagnosis of prostate cancer.  He underwent external beam radiation in 1993 and his PSA nadir was around 0.81 in 1995.  The patient had a PSA rise up to 4.1 in 2001 and was treated with androgen deprivation for an extended period of time.  In April 2012 his PSA rose up to 94 and most recently in July 2013 was up as 228.  Bicalutamide was added in July 2013 and PSA dropped down to 207 in January 2014.  The patient has had staging workup in 2012 including a bone scan that was negative, but has recently declined another imaging study.  Clinically, Mr. Holquin is relatively asymptomatic from what we can tell from his prostate cancer.  He does not really report any severe pain, does not report any back pain.  Does report some hip discomfort, only with excessive activity and has been really chronic in nature.  He still eats very well.  His performance status is still reasonable.  He still drives short distances.  He still lives, again, in the independent section of the senior living facility. He still has a dog that he tends for without any really hindrance or decline.  As mentioned, does not report any severe pains, does not report any hematuria.  His urine flow is reasonable.  Overall  quality of life is reasonable.  REVIEW OF SYSTEMS:  Does not report any headaches, blurry vision, double vision.  Does not report any motor or sensory neuropathy.  Does not report any alteration in status.  Does not report any psychiatric issues, depression.  Does not report any fever, chills, sweats.  Does not report any cough, hemoptysis, hematemesis.  No nausea or vomiting. No abdominal pain, hematochezia, melena, or genitourinary complaints. Rest of review of systems is unremarkable.  PAST MEDICAL HISTORY:  Significant for atrial fibrillation, history of glaucoma, mild hypertension, and prostate cancer as mentioned.  MEDICATIONS:  He is on amiodarone, aspirin, atorvastatin, benazepril, glutamate, multivitamin, diltiazem, Lumigan, Lupron, nitroglycerin, spironolactone, and __________.  ALLERGIES:  None.  FAMILY HISTORY:  Really no history of prostate cancer that he can tell.  SOCIAL HISTORY:  She is widowed.  He has 2 children, a son accompanies him today, he lives in [place].  Denied any alcohol or tobacco abuse.  PHYSICAL EXAMINATION:  General:  Alert, awake gentleman, appeared in no active distress today.  Vital Signs:  Blood pressure was 187/75, pulse 67, respirations 20, temperature is 97.  HEENT:  Head is normocephalic, atraumatic.  Pupils equal, round, reactive to light.  Oral mucosa moist and pink.  Neck:  Supple without adenopathy.  Heart:  Regular rate and rhythm.  S1 and S2.  Lungs:  Clear to auscultation.  No rhonchi, wheeze, or dullness  to percussion.  Abdomen:  Soft, nontender.  No hepatosplenomegaly.  Extremities:  No clubbing, cyanosis, or edema. Neurologic:  Intact motor, sensory, and deep tendon reflexes.  LABORATORY DATA:  Showed a hemoglobin of 13, white cell count of 6.4, platelet count of 160.  Creatinine of 1.5.  Normal liver function tests.  ASSESSMENT AND PLAN:  A 79 year old gentleman with the following issues.  1. Prostate cancer diagnosed in  1993.  He had a Gleason score of 7,     PSA of 5.  He was previously treated with external beam radiation     with a PSA nadir of 0.8.  In 2001 treated with androgen deprivation     with Lupron and in July 2013 Casodex was added for a PSA of 228.     Most recently his PSA is 207.  I had a lengthy discussion today     with Mr. Sam and has a son, discussing the natural course of     prostate cancer, more specifically castration-resistant prostate     cancer which he could possibly be developing at this time.  His     most recent testosterone level was castrate and it is reasonable if     his PSA started to rise again despite combined androgen deprivation     that he may be developing castration-resistant disease.  At this     point, I have discussed with them really the management approach     would include staging workup including bone scan and CT scan and     certainly if he has measurable disease, then we can talk about     different treatment options that include Gwynneth Albright, systemic     chemotherapy, preventative immunotherapy.  Mr. Harbor is really     not interested in most of these treatments, he has really mostly     interested in his quality of life.  He believes, given his advanced     age, that really he does not want to embark on any unnecessary     testing or unnecessary treatment at this time.  He feels that his     quality of life is reasonable.  I do agree that I do not think his     cancer is really making him symptomatic at this point.  He is more     open to imaging studies only if he is symptomatic from his disease.     I think that is reasonable at this time.  I told him that we can     offer him palliative radiation therapy if he develops bony pain, we     can certainly switch his Casodex to a different agent such as     Zytiga if his PSA started to rise rapidly and becomes symptomatic.     That is really his goal and that is his approach to treating his      prostate cancer and certainly at this point, I do not disagree.  So     from that standpoint, will follow him clinically, repeat his PSA     testing in about 4 months, and then we can have another     conversation.  Should he develop symptoms in the meantime, we can     certainly expedite any workup. 2. Bone health.  He has had Zometa in the past and has had a reaction     to it.  I have discussed with him possible Prolia versus  Xgeva for     osteoporosis versus metastatic bone cancer.  At this time, again,     his preference at this point is towards quality of life and     declined further workup at this time.  Followup will be in 4     month's time, as mentioned.    ______________________________ Benjiman Core, M.D. FNS/MEDQ  D:  01/07/2013  T:  01/08/2013  Job:  [id number]

## 2013-01-09 ENCOUNTER — Telehealth: Payer: Self-pay | Admitting: Oncology

## 2013-01-09 NOTE — Telephone Encounter (Signed)
s.w. pt and advised on June appt....pt ok and aware °

## 2013-01-13 ENCOUNTER — Ambulatory Visit (INDEPENDENT_AMBULATORY_CARE_PROVIDER_SITE_OTHER): Payer: Medicare Other | Admitting: Cardiology

## 2013-01-13 ENCOUNTER — Encounter: Payer: Self-pay | Admitting: Cardiology

## 2013-01-13 VITALS — BP 190/82 | HR 57 | Ht 64.0 in | Wt 145.0 lb

## 2013-01-13 DIAGNOSIS — I5032 Chronic diastolic (congestive) heart failure: Secondary | ICD-10-CM

## 2013-01-13 DIAGNOSIS — E785 Hyperlipidemia, unspecified: Secondary | ICD-10-CM

## 2013-01-13 DIAGNOSIS — I10 Essential (primary) hypertension: Secondary | ICD-10-CM

## 2013-01-13 DIAGNOSIS — I4891 Unspecified atrial fibrillation: Secondary | ICD-10-CM

## 2013-01-13 LAB — TSH: TSH: 1.9 u[IU]/mL (ref 0.35–5.50)

## 2013-01-13 MED ORDER — AMLODIPINE BESYLATE 5 MG PO TABS: 5.0000 mg | ORAL_TABLET | Freq: Every day | ORAL | Status: DC

## 2013-01-13 NOTE — Progress Notes (Signed)
Patient ID: Henry Meyers, male   DOB: 27-Jun-1920, 79 y.o.   MRN: [id number] PCP: Dr. Redmond School  79 yo with paroxysmal atrial fibrillation and diastolic CHF presents for followup.  He was hospitalized twice in 2/11 with atrial fibrillation and rapid ventricular response.  While in the hospital, he was diuresed for acute diastolic CHF.  He has been cardioverted twice now and remains in NSR today. He has been on amiodarone.  He is now living in an assisted living.  He was on Pradaxa in the past but this was stopped after an episode of significant GI bleeding, and he is on aspirin only at this time.  Henry Meyers was hospitalized in 11/12 with a presumed seizure.  Head CT showed an old left posterior frontal infarct but nothing acute.  The old CVA could have been a focus for a seizure.  He is now taking Zonegran.   Symptomatically doing well currently.  No tachypalpitations.   He walks his dog daily and rides his stationary bike for 10 minutes at a time.  No dyspnea walking on flat ground but mild dyspnea walking up a flight of steps.  No chest pain.   No falls.  He is steady on his feet with his cane or a walker.  Main limitation is hip and back pain.  No melena or hematochezia.  Weight is down 2 lbs.  Lower extremity edema is stable.  I did stop his spironolactone after last appointment due to rise in creatinine.  BP is high today and has been elevated from time to time at home (up to 190s systolic).    Labs (11/12): K 4.1, creatinine 0.86 Labs (12/12): TSH, LFTs normal Labs (3/13): K 4.5, creatinine 1.1, LDL 91, HDL 68, TSH/LFTs normal Labs (6/13): HCT 40.5, plts 138 Labs (9/13): LDL 97, HDL 62, TSH normal Labs (2/14): K 4.2, creatinine 1.5, LFTs normal  ECG: NSR  Allergies (verified):  No Known Drug Allergies  Past Medical History: 1. Hypertension  2. Hyperlipidemia 3. History of nonsustained ventricular tachycardia in 2004 after wrist surgery. 4. History of prostate cancer, status post radioactive  seeds and now on Lupron therapy. 5. Diastolic CHF: Echo (2/11) with EF 50-55%, mild left atrial enlargement, no significant valvular abnormalities. 6. Atrial fibrillation: Poorly tolerated, rapid response and develops CHF.  He is now on amiodarone.  PFTs (3/11) with mild obstructive defect, normal diffusion.  Pradaxa was stopped in [place] in 1/12 because of life-threatening GI bleed and we decided not to restart anticoagulation.  7. Elevated troponin in the setting of atrial fibrillation with RVR: Lexiscan myoview in 3/11 showed EF 76%, no ischemia or infarction.   8. GI bleed in the setting of colitis.  Pradaxa stopped (1/12).  9. CVA: old CVA seen on head CT 11/12.  Carotid dopplers (2/13) were normal.  10.  Seizure 11/12: prior CVA may have been focus for seizure activity.  11. Thrombocytopenia: mild.   Family History: Mother died at age 10.  Father died at age 68 due to  Parkinson disease.  There is no family history of premature coronary artery disease.  Social History: He lives in [place].  He he is a widower and retired. Denies any tobacco use.  He drinks alcohol socially. 2 sons, closest lives in [place] and the other lives in [place].   He is now in an assisted living facility.   ROS: All systems reviewed and negative except as per HPI.    Current Outpatient Prescriptions  Medication Sig Dispense Refill  . amiodarone (PACERONE) 200 MG tablet 1/2 tablet daily  45 tablet  3  . aspirin 81 MG tablet Take 81 mg by mouth daily.        Marland Kitchen atorvastatin (LIPITOR) 10 MG tablet Take 1 tablet (10 mg total) by mouth daily.  90 tablet  3  . benazepril (LOTENSIN) 10 MG tablet Take 1 tablet (10 mg total) by mouth daily.  90 tablet  3  . bicalutamide (CASODEX) 50 MG tablet Take 50 mg by mouth daily.      . bimatoprost (LUMIGAN) 0.03 % ophthalmic drops as directed.        . Multiple Vitamin (MULTIVITAMIN) tablet Take 1 tablet by mouth daily.        . nitroGLYCERIN (NITROSTAT) 0.4 MG SL  tablet Place 1 tablet (0.4 mg total) under the tongue every 5 (five) minutes as needed.  100 tablet  3  . zonisamide (ZONEGRAN) 100 MG capsule Take 2 capsules (200 mg total) by mouth daily.  180 capsule  3  . amLODipine (NORVASC) 5 MG tablet Take 1 tablet (5 mg total) by mouth daily.  90 tablet  3   No current facility-administered medications for this visit.    BP 190/82  Pulse 57  Ht 5\' 4"  (1.626 m)  Wt 145 lb (65.772 kg)  BMI 24.88 kg/m2  SpO2 97% General:  Well developed, well nourished, in no acute distress. Neck:  Neck supple, no JVD. No masses, thyromegaly or abnormal cervical nodes. Lungs:  Clear bilaterally to auscultation and percussion. Heart:  Non-displaced PMI, chest non-tender; regular rate and rhythm, S1, S2 without murmurs, rubs. +S4. Carotid upstroke normal, no bruit.  Pedals normal pulses. 1+ edema 1/2 up lower legs bilaterally.  Abdomen:  Bowel sounds positive; abdomen soft and non-tender without masses, organomegaly, or hernias noted. No hepatosplenomegaly. Extremities:  No clubbing or cyanosis. Neurologic:  Alert and oriented x 3. Psych:  Normal affect.  Assessment/Plan  1. ATRIAL FIBRILLATION  Patient is in NSR today. He tolerates atrial fibrillation poorly. I will have him continue amiodarone 100 mg daily.  - He will remain on ASA only for anticoagulation given severe GI bleed on Pradaxa. He has had a prior history of CVA so unfortunately is at considerable risk for stroke off anticoagulation but considerable risk for GI bleed with anticoagulation.  He also is a fall risk given some instability from hip and back pain. I think that the risk-benefit ratio falls towards aspirin only given his severe GI bleed.  - Recent LFTs were normal.  He needs TSH drawn today. He follows up regularly with an eye doctor.  2. DIASTOLIC HEART FAILURE, CHRONIC  No JVD though he does have peripheral edema.  He is off spironolactone due to elevated creatinine. He is symptomatically  stable (NYHA class II). I suspect that venous insufficiency plays a significant role in his peripheral edema.  I suggested that he wear support hose.  3. Hyperlipidemia Good lipids in 9/13.  4. HTN BP has been running high.  I will have him stop diltiazem CD and will use amlodipine 5 mg daily instead for better BP control. We will call for a blood pressure check in a month.    Marca Ancona 01/13/2013

## 2013-01-13 NOTE — Patient Instructions (Addendum)
Stop diltiazem.  Start amlodipine(norvasc) 5mg  daily.   Your physician recommends that you have  lab work today--TSH  Take and record your blood pressure. I will call you in about a month to see how your blood pressure has been. Luana Shu [phone]  Your physician recommends that you schedule a follow-up appointment in: 4 months with Dr Shirlee Latch.

## 2013-01-15 ENCOUNTER — Telehealth: Payer: Self-pay | Admitting: Cardiology

## 2013-01-15 NOTE — Telephone Encounter (Signed)
He should start the amlodipine since I had him stop diltiazem.

## 2013-01-15 NOTE — Telephone Encounter (Signed)
Spoke with Tresa Endo (NP who is pt's primary provider). She reports she saw pt on 2/26 and blood pressure was 118/78. Home Health Nurse had seen pt earlier on 2/26 and systolic blood pressure was 130.  Pt was told not to start Norvasc due to these readings. Blood pressure had been much higher when he saw Dr. Shirlee Latch on 2/25. Tresa Endo is asking if pt should start Norvasc.  Pt is seen by [facility] Nurse weekly but this can be increased to 3 times per week if Dr. Shirlee Latch feels this is needed. Will send to Dr. Shirlee Latch for review

## 2013-01-15 NOTE — Telephone Encounter (Signed)
New problem   Kelly/NP went out to see pt and his B/Pwas 118/78 and she stated he was seen earlier that day  By home health nuse with systolic of 130.Marland KitchenMarland KitchenWill not start Norvasc b/c of this..If any question please give her a call.

## 2013-01-15 NOTE — Telephone Encounter (Signed)
Pt rtn call to [facility] and he can be reached at same number

## 2013-01-16 NOTE — Telephone Encounter (Signed)
That is fine.  Keep eye on BP on diltiazem, call if it starts to rise.

## 2013-01-16 NOTE — Telephone Encounter (Signed)
Spoke with patient. Pt states that he does not want to change to Norvasc. He states his BP was high the morning he saw Dr Shirlee Latch because he had been sick that morning. He did not stop Diltiazem. His BP was 118/78 01/14/13, the day after he saw Dr Shirlee Latch.  He states this has been the usual range for his BP. He does not want to stop diltiazem and start norvasc. He states he can have his BP checked where he lives. I will forward to Dr Shirlee Latch for review.

## 2013-01-16 NOTE — Telephone Encounter (Signed)
Pt advised.

## 2013-01-16 NOTE — Telephone Encounter (Signed)
New problem   Debby called pt yesterday and left vm for pt to call back today to talk to you so he can get his results.Pt was returning the call back

## 2013-05-05 ENCOUNTER — Encounter: Payer: Self-pay | Admitting: Cardiology

## 2013-05-05 ENCOUNTER — Ambulatory Visit (INDEPENDENT_AMBULATORY_CARE_PROVIDER_SITE_OTHER): Payer: Medicare Other | Admitting: Cardiology

## 2013-05-05 VITALS — BP 130/78 | HR 64 | Ht 64.0 in | Wt 149.0 lb

## 2013-05-05 DIAGNOSIS — E785 Hyperlipidemia, unspecified: Secondary | ICD-10-CM

## 2013-05-05 DIAGNOSIS — I5032 Chronic diastolic (congestive) heart failure: Secondary | ICD-10-CM

## 2013-05-05 DIAGNOSIS — R238 Other skin changes: Secondary | ICD-10-CM

## 2013-05-05 DIAGNOSIS — R0602 Shortness of breath: Secondary | ICD-10-CM

## 2013-05-05 DIAGNOSIS — I4891 Unspecified atrial fibrillation: Secondary | ICD-10-CM

## 2013-05-05 DIAGNOSIS — I10 Essential (primary) hypertension: Secondary | ICD-10-CM

## 2013-05-05 LAB — CBC WITH DIFFERENTIAL/PLATELET
Basophils Relative: 0.4 % (ref 0.0–3.0)
Eosinophils Relative: 0.4 % (ref 0.0–5.0)
MCV: 97.5 fl (ref 78.0–100.0)
Monocytes Absolute: 0.7 10*3/uL (ref 0.1–1.0)
Monocytes Relative: 10 % (ref 3.0–12.0)
Neutrophils Relative %: 77.5 % — ABNORMAL HIGH (ref 43.0–77.0)
RBC: 4.28 Mil/uL (ref 4.22–5.81)
WBC: 7.2 10*3/uL (ref 4.5–10.5)

## 2013-05-05 LAB — BRAIN NATRIURETIC PEPTIDE: Pro B Natriuretic peptide (BNP): 270 pg/mL — ABNORMAL HIGH (ref 0.0–100.0)

## 2013-05-05 NOTE — Patient Instructions (Addendum)
Decrease aspirin to 81mg  EVERY OTHER DAY.  Your physician recommends that you have  lab work today--Lipid profile/liver profile/TSH/BMET/BNP/CBCd. Copy to Dr Talmadge Coventry [phone] ph [phone] fax   Your physician wants you to follow-up in: 6 months with Dr Shirlee Latch. (December 2014). You will receive a reminder letter in the mail two months in advance. If you don't receive a letter, please call our office to schedule the follow-up appointment.

## 2013-05-06 LAB — LIPID PANEL
HDL: 75.3 mg/dL (ref >39.00)
Total CHOL/HDL Ratio: 2
VLDL: 14.6 mg/dL (ref 0.0–40.0)

## 2013-05-06 LAB — BASIC METABOLIC PANEL
CO2: 17 mEq/L — ABNORMAL LOW (ref 19–32)
Calcium: 9 mg/dL (ref 8.4–10.5)
Chloride: 106 mEq/L (ref 96–112)
Sodium: 143 mEq/L (ref 135–145)

## 2013-05-06 LAB — HEPATIC FUNCTION PANEL: Albumin: 4 g/dL (ref 3.5–5.2)

## 2013-05-06 LAB — TSH: TSH: 1.74 u[IU]/mL (ref 0.35–5.50)

## 2013-05-07 ENCOUNTER — Ambulatory Visit (HOSPITAL_BASED_OUTPATIENT_CLINIC_OR_DEPARTMENT_OTHER): Payer: Medicare Other | Admitting: Oncology

## 2013-05-07 ENCOUNTER — Other Ambulatory Visit (HOSPITAL_BASED_OUTPATIENT_CLINIC_OR_DEPARTMENT_OTHER): Payer: Medicare Other | Admitting: Lab

## 2013-05-07 ENCOUNTER — Telehealth: Payer: Self-pay | Admitting: Oncology

## 2013-05-07 VITALS — BP 183/72 | HR 67 | Temp 97.7°F | Resp 18 | Ht 64.0 in | Wt 150.6 lb

## 2013-05-07 DIAGNOSIS — C61 Malignant neoplasm of prostate: Secondary | ICD-10-CM

## 2013-05-07 LAB — COMPREHENSIVE METABOLIC PANEL (CC13)
CO2: 21 mEq/L — ABNORMAL LOW (ref 22–29)
Calcium: 9.1 mg/dL (ref 8.4–10.4)
Glucose: 119 mg/dl — ABNORMAL HIGH (ref 70–99)
Sodium: 137 mEq/L (ref 136–145)
Total Bilirubin: 0.4 mg/dL (ref 0.20–1.20)
Total Protein: 6.4 g/dL (ref 6.4–8.3)

## 2013-05-07 LAB — CBC WITH DIFFERENTIAL/PLATELET
Eosinophils Absolute: 0.1 10*3/uL (ref 0.0–0.5)
HCT: 39.2 % (ref 38.4–49.9)
LYMPH%: 16 % (ref 14.0–49.0)
MONO#: 0.5 10*3/uL (ref 0.1–0.9)
NEUT#: 4.3 10*3/uL (ref 1.5–6.5)
NEUT%: 75 % (ref 39.0–75.0)
Platelets: 153 10*3/uL (ref 140–400)
RBC: 4.1 10*6/uL — ABNORMAL LOW (ref 4.20–5.82)
WBC: 5.7 10*3/uL (ref 4.0–10.3)

## 2013-05-07 LAB — PSA: PSA: 340.9 ng/mL — ABNORMAL HIGH (ref <=4.00)

## 2013-05-07 NOTE — Progress Notes (Signed)
Hematology and Oncology Follow Up Visit  Henry Meyers [id number] 05-Dec-1919 79 y.o. 05/07/2013 1:31 PM Florentina Jenny, MDGuest, Ashley Jacobs, MD   Principle Diagnosis: 79 year old gentleman with prostate cancer diagnosed in 1993 he had a Gleason score of 7 and a PSA of 5.   Prior Therapy: He is status post external beam radiation in 1993 with a PSA nadir down to 0.81. In 2001 he was treated with androgen depravation intermittently due to rise in his PSA. In April 2012 his weight up to 94 and most recently in July 2013 PSA was up to 228.  Current therapy: He is on combined androgen deprivation with Lupron and Casodex and his most recent PSA is 114.7 on 01/07/2013. It is unclear whether he have developed castration resistant disease most likely he has pretty of refused staging workup at this time.  Interim History:  Mr. Vise presents today for a followup visit for the evaluation of advanced prostate cancer after I saw him an evaluation back in February of 2014. His PSA have been up to as high as 228 last year, but now is down to 114. He refused staging bone scans from that time but its possible he had developed castration resistant disease. He elected to continue with observation and surveillance due to his age and the fact that is asymptomatic. Since his last evaluation is continued to be asymptomatic and continue to be relatively symptom free. He is not reporting any worsening back pain, shoulder pain, hip pain or genitourinary pains. He still able to drive short distances and walks with his dog on a regular basis.  Medications: I have reviewed the patient's current medications.  Current Outpatient Prescriptions  Medication Sig Dispense Refill  . amiodarone (PACERONE) 200 MG tablet 1/2 tablet daily  45 tablet  3  . aspirin EC 81 MG tablet 1 every other day      . atorvastatin (LIPITOR) 10 MG tablet Take 1 tablet (10 mg total) by mouth daily.  90 tablet  3  . benazepril (LOTENSIN) 10 MG tablet  Take 1 tablet (10 mg total) by mouth daily.  90 tablet  3  . bicalutamide (CASODEX) 50 MG tablet Take 50 mg by mouth daily.      . bimatoprost (LUMIGAN) 0.03 % ophthalmic drops as directed.        . diltiazem (CARDIZEM CD) 120 MG 24 hr capsule Take 1 capsule (120 mg total) by mouth daily.      . Multiple Vitamin (MULTIVITAMIN) tablet Take 1 tablet by mouth daily.        . nitroGLYCERIN (NITROSTAT) 0.4 MG SL tablet Place 1 tablet (0.4 mg total) under the tongue every 5 (five) minutes as needed.  100 tablet  3  . zonisamide (ZONEGRAN) 100 MG capsule Take 2 capsules (200 mg total) by mouth daily.  180 capsule  3   No current facility-administered medications for this visit.     Allergies: No Known Allergies  Past Medical History, Surgical history, Social history, and Family History were reviewed and updated.  Review of Systems: Constitutional:  Negative for fever, chills, night sweats, anorexia, weight loss, pain. Cardiovascular: no chest pain or dyspnea on exertion Respiratory: negative Neurological: negative Dermatological: negative ENT: negative Skin: Negative. Gastrointestinal: negative Genito-Urinary: negative Hematological and Lymphatic: negative Breast: negative Musculoskeletal: negative Remaining ROS negative. Physical Exam: Blood pressure 183/72, pulse 67, temperature 97.7 F (36.5 C), temperature source Oral, resp. rate 18, height 5\' 4"  (1.626 m), weight 150 lb 9.6 oz (68.312 kg),  SpO2 100.00%. ECOG: 2 General appearance: alert Head: Normocephalic, without obvious abnormality, atraumatic Neck: no adenopathy, no carotid bruit, no JVD, supple, symmetrical, trachea midline and thyroid not enlarged, symmetric, no tenderness/mass/nodules Lymph nodes: Cervical, supraclavicular, and axillary nodes normal. Heart:regular rate and rhythm, S1, S2 normal, no murmur, click, rub or gallop Lung:chest clear, no wheezing, rales, normal symmetric air entry Abdomin: soft, non-tender,  without masses or organomegaly EXT:no erythema, induration, or nodules   Lab Results: Lab Results  Component Value Date   WBC 5.7 05/07/2013   HGB 13.2 05/07/2013   HCT 39.2 05/07/2013   MCV 95.6 05/07/2013   PLT 153 05/07/2013     Chemistry      Component Value Date/Time   NA 143 05/05/2013 1510   NA 138 01/07/2013 1343   K 5.0 05/05/2013 1510   K 4.2 01/07/2013 1343   CL 106 05/05/2013 1510   CL 105 01/07/2013 1343   CO2 17* 05/05/2013 1510   CO2 22 01/07/2013 1343   BUN 27* 05/05/2013 1510   BUN 25.8 01/07/2013 1343   CREATININE 1.6* 05/05/2013 1510   CREATININE 1.5* 01/07/2013 1343      Component Value Date/Time   CALCIUM 9.0 05/05/2013 1510   CALCIUM 9.1 01/07/2013 1343   ALKPHOS 81 05/05/2013 1510   ALKPHOS 98 01/07/2013 1343   AST 24 05/05/2013 1510   AST 18 01/07/2013 1343   ALT 15 05/05/2013 1510   ALT 13 01/07/2013 1343   BILITOT 0.5 05/05/2013 1510   BILITOT 0.42 01/07/2013 1343       Impression and Plan:  79 year old gentleman with the following issues.  1. Prostate cancer diagnosed in 1993. He had a Gleason score of 7, PSA of 5. He was previously treated with external beam radiation with a PSA nadir of 0.8. His most recent PSA in 01/07/2013 was 114.7. He had declined staging workup and he is continued to be on combined androgen depravation with Lupron and Casodex. At this time he continues to be asymptomatic and prefers not to change things unless he become symptomatic. I educated about possible symptoms that include bone pain decline in his energy or performance status as well as genitourinary symptoms. He be willing to try a different agent other than Casodex if he he shows signs of progression.  2. Bone health. He has had Zometa in the past and has had a reaction to it. I have discussed with him possible Prolia versus Xgeva for osteoporosis versus metastatic bone cancer. At this time, again, his preference at this point is towards quality of life and declined further workup at  this time.   Followup will be in November of 2014 sooner if needed to.  Eli Hose, MD 6/19/20141:31 PM

## 2013-05-07 NOTE — Progress Notes (Signed)
Patient ID: Henry Meyers, male   DOB: 1920-08-25, 79 y.o.   MRN: [id number] PCP: Dr. Sonia Baller  79 yo with paroxysmal atrial fibrillation and diastolic CHF presents for followup.  He was hospitalized twice in 2/11 with atrial fibrillation and rapid ventricular response.  While in the hospital, he was diuresed for acute diastolic CHF.  He has been cardioverted twice now and remains in NSR today. He has been on amiodarone.  He is now living in an assisted living.  He was on Pradaxa in the past but this was stopped after an episode of significant GI bleeding, and he is on aspirin only at this time.  Henry Meyers was hospitalized in 11/12 with a presumed seizure.  Head CT showed an old left posterior frontal infarct but nothing acute.  The old CVA could have been a focus for a seizure.  He is now taking Zonegran.   Symptomatically doing well currently.  No tachypalpitations.   He walks his dog daily and rides his stationary bike for 10 minutes at a time.  No dyspnea walking on flat ground for short distances but mild dyspnea walking up a flight of steps or walking more than about 200 feet.  No chest pain.   No falls.  He is steady on his feet with his cane or a walker.  Main limitation is hip and back pain.  No melena or hematochezia.  Lower extremity edema is stable.  SBP has been in the 130s recently (good control).  He is still driving.  He wants to stop aspirin because of excessive bleeding when he bumps into things.    Labs (11/12): K 4.1, creatinine 0.86 Labs (12/12): TSH, LFTs normal Labs (3/13): K 4.5, creatinine 1.1, LDL 91, HDL 68, TSH/LFTs normal Labs (6/13): HCT 40.5, plts 138 Labs (9/13): LDL 97, HDL 62, TSH normal Labs (2/14): K 4.2, creatinine 1.5, LFTs normal, TSH normal  ECG: NSR, normal  Allergies (verified):  No Known Drug Allergies  Past Medical History: 1. Hypertension  2. Hyperlipidemia 3. History of nonsustained ventricular tachycardia in 2004 after wrist surgery. 4. History of  prostate cancer, status post radioactive seeds and now on Lupron therapy. 5. Diastolic CHF: Echo (2/11) with EF 50-55%, mild left atrial enlargement, no significant valvular abnormalities. 6. Atrial fibrillation: Poorly tolerated, rapid response and develops CHF.  He is now on amiodarone.  PFTs (3/11) with mild obstructive defect, normal diffusion.  Pradaxa was stopped in [place] in 1/12 because of life-threatening GI bleed and we decided not to restart anticoagulation.  7. Elevated troponin in the setting of atrial fibrillation with RVR: Lexiscan myoview in 3/11 showed EF 76%, no ischemia or infarction.   8. GI bleed in the setting of colitis.  Pradaxa stopped (1/12).  9. CVA: old CVA seen on head CT 11/12.  Carotid dopplers (2/13) were normal.  10.  Seizure 11/12: prior CVA may have been focus for seizure activity.  11. Thrombocytopenia: mild.   Family History: Mother died at age 66.  Father died at age 80 due to  Parkinson disease.  There is no family history of premature coronary artery disease.  Social History: He lives in [place].  He he is a widower and retired. Denies any tobacco use.  He drinks alcohol socially. 2 sons, closest lives in [place] and the other lives in [place].   He is now in an assisted living facility.   ROS: All systems reviewed and negative except as per HPI.  Current Outpatient Prescriptions  Medication Sig Dispense Refill  . amiodarone (PACERONE) 200 MG tablet 1/2 tablet daily  45 tablet  3  . atorvastatin (LIPITOR) 10 MG tablet Take 1 tablet (10 mg total) by mouth daily.  90 tablet  3  . benazepril (LOTENSIN) 10 MG tablet Take 1 tablet (10 mg total) by mouth daily.  90 tablet  3  . bicalutamide (CASODEX) 50 MG tablet Take 50 mg by mouth daily.      . bimatoprost (LUMIGAN) 0.03 % ophthalmic drops as directed.        . diltiazem (CARDIZEM CD) 120 MG 24 hr capsule Take 1 capsule (120 mg total) by mouth daily.      . Multiple Vitamin (MULTIVITAMIN)  tablet Take 1 tablet by mouth daily.        . nitroGLYCERIN (NITROSTAT) 0.4 MG SL tablet Place 1 tablet (0.4 mg total) under the tongue every 5 (five) minutes as needed.  100 tablet  3  . zonisamide (ZONEGRAN) 100 MG capsule Take 2 capsules (200 mg total) by mouth daily.  180 capsule  3  . aspirin EC 81 MG tablet 1 every other day       No current facility-administered medications for this visit.    BP 130/78  Pulse 64  Ht 5\' 4"  (1.626 m)  Wt 149 lb (67.586 kg)  BMI 25.56 kg/m2  SpO2 98% General:  Well developed, well nourished, in no acute distress. Neck:  Neck supple, no JVD. No masses, thyromegaly or abnormal cervical nodes. Lungs:  Clear bilaterally to auscultation and percussion. Heart:  Non-displaced PMI, chest non-tender; regular rate and rhythm, S1, S2 without murmurs, rubs. +S4. Carotid upstroke normal, no bruit.  Pedals normal pulses. 1+ edema 1/2 up lower legs bilaterally.  Abdomen:  Bowel sounds positive; abdomen soft and non-tender without masses, organomegaly, or hernias noted. No hepatosplenomegaly. Extremities:  No clubbing or cyanosis. Neurologic:  Alert and oriented x 3. Psych:  Normal affect.  Assessment/Plan  1. ATRIAL FIBRILLATION  Patient is in NSR today. He tolerates atrial fibrillation poorly. I will have him continue amiodarone 100 mg daily.  - Check LFTs and TSH today.  He follows regularly with an eye doctor. - He will remain on ASA only for anticoagulation given severe GI bleed on Pradaxa. He has had a prior history of CVA so unfortunately is at considerable risk for stroke off anticoagulation but considerable risk for GI bleed with anticoagulation.  He also is a fall risk given some instability from hip and back pain. I think that the risk-benefit ratio falls towards aspirin only given his severe GI bleed. Today he asked me about stopping aspirin because even small cuts bleed excessively and he has a hard time stopping the bleeding.  I asked him to decrease  the aspirin to 81 mg every other day.   2. DIASTOLIC HEART FAILURE, CHRONIC  No JVD though he does have peripheral edema.  He is off spironolactone due to elevated creatinine. He is symptomatically stable (NYHA class II). I suspect that venous insufficiency plays a significant role in his peripheral edema.  Wear support hose.  Will check BMET and BNP today.  3. Hyperlipidemia Check lipids today.  4. HTN BP has been well-controlled recently.   Marca Ancona 05/07/2013

## 2013-05-07 NOTE — Telephone Encounter (Signed)
Gave pt appt for lab and MD on November 2014 °

## 2013-05-11 ENCOUNTER — Telehealth: Payer: Self-pay | Admitting: Cardiology

## 2013-05-11 NOTE — Telephone Encounter (Signed)
New Problem  Son states he received a message from you but he could not understand it.  He asked if you could call him back. He said it has something to do with changing his diet due to his blood test results.

## 2013-05-11 NOTE — Telephone Encounter (Signed)
Pt son, Loraine Leriche, is aware of lab results & recommendations. States father recently had lab work in another md office & K+ was 4.7. This in spite of eating 2 bananas & orange juice at breakfast in the mornings. Will follow recommendations for low potassium diet Mylo Red RN

## 2013-06-21 ENCOUNTER — Other Ambulatory Visit: Payer: Self-pay | Admitting: Neurology

## 2013-06-24 ENCOUNTER — Other Ambulatory Visit: Payer: Self-pay

## 2013-06-30 ENCOUNTER — Other Ambulatory Visit: Payer: Self-pay | Admitting: Urology

## 2013-06-30 DIAGNOSIS — C61 Malignant neoplasm of prostate: Secondary | ICD-10-CM

## 2013-07-03 ENCOUNTER — Other Ambulatory Visit: Payer: Self-pay | Admitting: Cardiology

## 2013-07-06 ENCOUNTER — Encounter (HOSPITAL_COMMUNITY): Payer: Medicare Other

## 2013-07-21 ENCOUNTER — Other Ambulatory Visit: Payer: Self-pay | Admitting: Orthopedic Surgery

## 2013-07-21 DIAGNOSIS — M545 Low back pain: Secondary | ICD-10-CM

## 2013-08-01 ENCOUNTER — Other Ambulatory Visit: Payer: Self-pay | Admitting: Cardiology

## 2013-08-20 ENCOUNTER — Ambulatory Visit
Admission: RE | Admit: 2013-08-20 | Discharge: 2013-08-20 | Disposition: A | Discharge status: Home / Self Care | Payer: Medicare Other | Source: Ambulatory Visit | Attending: Orthopedic Surgery | Admitting: Orthopedic Surgery

## 2013-08-20 DIAGNOSIS — M545 Low back pain: Secondary | ICD-10-CM

## 2013-08-24 ENCOUNTER — Telehealth: Payer: Self-pay | Admitting: Cardiology

## 2013-08-24 ENCOUNTER — Other Ambulatory Visit: Payer: Medicare Other

## 2013-08-24 MED ORDER — DILTIAZEM HCL ER COATED BEADS 120 MG PO CP24: 120.0000 mg | ORAL_CAPSULE | Freq: Every day | ORAL | Status: AC

## 2013-08-24 NOTE — Telephone Encounter (Signed)
Pt advised will send in prescription to [facility] for cardizem.

## 2013-08-24 NOTE — Telephone Encounter (Signed)
New problem  Pt needs new presc for cardenzen---[facility] mail order . Call him at [phone]

## 2013-08-25 ENCOUNTER — Other Ambulatory Visit: Payer: Self-pay | Admitting: Oncology

## 2013-08-25 ENCOUNTER — Other Ambulatory Visit: Payer: Self-pay | Admitting: Orthopedic Surgery

## 2013-08-25 ENCOUNTER — Telehealth: Payer: Self-pay | Admitting: *Deleted

## 2013-08-25 DIAGNOSIS — C61 Malignant neoplasm of prostate: Secondary | ICD-10-CM

## 2013-08-25 DIAGNOSIS — M25552 Pain in left hip: Secondary | ICD-10-CM

## 2013-08-25 NOTE — Telephone Encounter (Signed)
Loraine Leriche, pt's son called states My dad Henry Meyers had and MRI on 10/2 and Dr. Madelon Lips said we should see Dr. Clelia Croft  because he has a tumor on his spine. He has another MRI on hip 10/9. Reviewed with MD, referral to Radiation Oncologist made. Informed Loraine Leriche to expect a call from Scheduling. No further concerns.

## 2013-08-27 ENCOUNTER — Ambulatory Visit
Admission: RE | Admit: 2013-08-27 | Discharge: 2013-08-27 | Disposition: A | Discharge status: Home / Self Care | Payer: Medicare Other | Source: Ambulatory Visit | Attending: Orthopedic Surgery | Admitting: Orthopedic Surgery

## 2013-08-27 DIAGNOSIS — M25552 Pain in left hip: Secondary | ICD-10-CM

## 2013-08-28 ENCOUNTER — Ambulatory Visit
Admission: RE | Admit: 2013-08-28 | Discharge: 2013-08-28 | Disposition: A | Discharge status: Home / Self Care | Payer: Medicare Other | Source: Ambulatory Visit | Attending: Radiation Oncology | Admitting: Radiation Oncology

## 2013-08-28 ENCOUNTER — Encounter: Payer: Self-pay | Admitting: Radiation Oncology

## 2013-08-28 VITALS — BP 182/107 | HR 69 | Temp 97.6°F | Resp 20 | Wt 145.0 lb

## 2013-08-28 DIAGNOSIS — C419 Malignant neoplasm of bone and articular cartilage, unspecified: Secondary | ICD-10-CM

## 2013-08-28 DIAGNOSIS — Z8673 Personal history of transient ischemic attack (TIA), and cerebral infarction without residual deficits: Secondary | ICD-10-CM | POA: Insufficient documentation

## 2013-08-28 DIAGNOSIS — C7951 Secondary malignant neoplasm of bone: Secondary | ICD-10-CM | POA: Insufficient documentation

## 2013-08-28 DIAGNOSIS — I10 Essential (primary) hypertension: Secondary | ICD-10-CM | POA: Insufficient documentation

## 2013-08-28 DIAGNOSIS — C61 Malignant neoplasm of prostate: Secondary | ICD-10-CM | POA: Insufficient documentation

## 2013-08-28 DIAGNOSIS — I4891 Unspecified atrial fibrillation: Secondary | ICD-10-CM | POA: Insufficient documentation

## 2013-08-28 HISTORY — DX: Unspecified glaucoma: H40.9

## 2013-08-28 NOTE — Progress Notes (Signed)
Radiation Oncology         [phone]) [phone] ________________________________  Initial outpatient Consultation - Date: 08/28/2013   Name: Henry Meyers MRN: [id number]   DOB: 1920-11-15  REFERRING PHYSICIAN: Benjiman Core, MD  DIAGNOSIS: Metastatic Prostate Cancer to bone  HISTORY OF PRESENT ILLNESS::Henry Meyers is a 79 y.o. male  who was originally diagnosed with prostate cancer in 1993. He was treated with radiation alone. He was started on androgen deprivation in September 2001. He was started on Casodex in 2013. He presented to his orthopedic surgeon's office with complaints of hip pain. He received a cortisone injection in the lower part of his pain was improved but he felt the upper part continued. This has been for about the past 2-3 months. If his limited his ability to walk his dog. It is not enough where he wants to take an anti-inflammatory or pain medicine. He sleeps well at night. He states the pain is worse with movement and seems to go away with rest. He has not noticed any weakness or numbness of his leg. He is accompanied by his son today. He has no complaints elsewhere. He is quite active for a gentleman his age. He lives in an assisted living facility. I reviewed his lumbar spine MRI from last month as well as the MRI of the left hip he had performed yesterday with radiology.  PREVIOUS RADIATION THERAPY: Yes Prostate RT to 65 Gray completed 03/10/92  PAST MEDICAL HISTORY:  has a past medical history of HTN (hypertension); HLD (hyperlipidemia); Ventricular tachycardia, non-sustained (2004); Diastolic CHF, chronic; Atrial fibrillation; Elevated troponin; Heart murmur; Seizures; CHF (congestive heart failure); CVA (cerebral infarction); Bone cancer (08/27/13 MR); Prostate cancer (1993); and Glaucoma.    PAST SURGICAL HISTORY: Past Surgical History  Procedure Laterality Date  . Wrist surgery      left/screws  . Cataract extraction      bilat.eyes    FAMILY HISTORY:  Family  History  Problem Relation Age of Onset  . Parkinsonism Father     SOCIAL HISTORY:  History  Substance Use Topics  . Smoking status: Never Smoker   . Smokeless tobacco: Never Used  . Alcohol Use: Yes     Comment: socially    ALLERGIES: Review of patient's allergies indicates no known allergies.  MEDICATIONS:  Current Outpatient Prescriptions  Medication Sig Dispense Refill  . amiodarone (PACERONE) 200 MG tablet TAKE 1/2 TABLET DAILY  30 tablet  6  . aspirin EC 81 MG tablet 1 every other day      . benazepril (LOTENSIN) 10 MG tablet Take 1 tablet (10 mg total) by mouth daily.  90 tablet  3  . bicalutamide (CASODEX) 50 MG tablet Take 50 mg by mouth daily.      . bimatoprost (LUMIGAN) 0.03 % ophthalmic drops as directed.        . diltiazem (CARDIZEM CD) 120 MG 24 hr capsule Take 1 capsule (120 mg total) by mouth daily.  90 capsule  3  . docusate sodium (COLACE) 100 MG capsule Take 100 mg by mouth every morning. daily      . Multiple Vitamin (MULTIVITAMIN) tablet Take 1 tablet by mouth daily.        Marland Kitchen NITROSTAT 0.4 MG SL tablet DISSOLVE 1 TABLET UNDER THETONGUE EVERY 5 MINUTES AS  NEEDED  100 tablet  3  . zonisamide (ZONEGRAN) 100 MG capsule Take 2 capsules (200 mg total) by mouth daily.  180 capsule  3  . atorvastatin (  LIPITOR) 10 MG tablet Take 1 tablet (10 mg total) by mouth daily.  90 tablet  3   No current facility-administered medications for this encounter.    REVIEW OF SYSTEMS:  A 15 point review of systems is documented in the electronic medical record. This was obtained by the nursing staff. However, I reviewed this with the patient to discuss relevant findings and make appropriate changes.  Pertinent items are noted in HPI.  PHYSICAL EXAM:  Filed Vitals:   08/28/13 1014  BP: 182/107  Pulse: 69  Temp: 97.6 F (36.4 C)  Resp: 20  .145 lb (65.772 kg). He is a pleasant male who appears younger than his stated age. He has some tenderness to palpation over the left lateral  hip. He walks with a cane. He has a steady gait with his cane. He is alert and oriented x3.  LABORATORY DATA:  Lab Results  Component Value Date   WBC 5.7 05/07/2013   HGB 13.2 05/07/2013   HCT 39.2 05/07/2013   MCV 95.6 05/07/2013   PLT 153 05/07/2013   Lab Results  Component Value Date   NA 137 05/07/2013   K 4.7 05/07/2013   CL 106 05/07/2013   CO2 21* 05/07/2013   Lab Results  Component Value Date   ALT 11 05/07/2013   AST 17 05/07/2013   ALKPHOS 88 05/07/2013   BILITOT 0.40 05/07/2013     RADIOGRAPHY: Mr Lumbar Spine Wo Contrast  08/20/2013   CLINICAL DATA:  Low back pain and left lower extremity pain and weakness. History of prostate cancer.  EXAM: MRI LUMBAR SPINE WITHOUT CONTRAST  TECHNIQUE: Multiplanar, multisequence MR imaging was performed. No intravenous contrast was administered.  COMPARISON:  Bone scan and CT scan dated 08/06/2011  FINDINGS: The patient has marked hydronephrosis of the right kidney. The right ureter is dilated into the pelvis below the L5-S1 level, new since the prior CT scan of 08/06/2011. There are also new left periaortic and retroaortic lymph nodes best seen on image number 19 of series 7 with a diameter of 12 mm.  There are innumerable lesions throughout the entire spine including the sacrum and the visualized portions of the iliac bones. This could represent metastatic prostate cancer or possibly multiple myeloma.  T12-L1: Disc space narrowing with broad-based osteophytes and slight bulging of the disc without neural impingement.  L1-2: The central soft disc protrusion asymmetric to the left with hypertrophy of the facet joints and ligamentum flava creating moderate spinal stenosis and left lateral recess compression.  L2-3: Broad-based disc osteophyte complex symmetrically indents the thecal sac. Slight hypertrophy of the left facet joint combines to create slight spinal stenosis and bilateral lateral recess stenosis, left greater than right.  L3-4: Disk space is  fused. Chronic endplate osteophytes and hypertrophy of the ligamentum flava create chronic fairly severe spinal stenosis and left lateral recess compression. Chronic deviation of the left L3 nerve lateral to the neural foramen.  L4-5: The disk space is fused. Chronic broad-based osteophytes narrowing both lateral recesses the could affect either or both L5 nerve respect this is not acute.  L5-S1: Broad-based osteophytes indent the ventral aspect of the thecal sac. Prominent anterior right lateral osteophytes appear to fuse the L5-S1 level. No neural impingement.  IMPRESSION: 1. Diffuse metastatic disease throughout the visualized portion of the skeleton. No evidence of tumor in the spinal canal. No impending pathologic fractures. 2. New marked hydronephrosis of the right kidney. The distended ureter extends into the bladder. New left  periaortic adenopathy. 3. Diffuse degenerative disc disease. The most likely symptomatic levels with a compression of the left lateral recesses at L1-2 and L2-3. 4. Chronic moderately severe spinal stenosis at L3-4 and chronic bilateral lateral recess stenosis at L4-5. However, those levels are fused.   Electronically Signed   By: Geanie Cooley   On: 08/20/2013 14:27   Mr Hip Left Wo Contrast  08/27/2013   CLINICAL DATA:  Increasing left hip pain for 2 months.  EXAM: MRI OF THE LEFT HIP WITHOUT CONTRAST  TECHNIQUE: Multiplanar, multisequence MR imaging was performed. No intravenous contrast was administered.  COMPARISON:  CT scan dated 08/06/2011  FINDINGS: There are numerous tiny metastatic lesions throughout the visualized bones of the pelvis and hips and the lumbar spine. There is no evidence of a stress reaction or stress fracture. There is inflammation of the distal left gluteus medius tendon and there is atrophy of the left gluteus medius and minimus muscles.  There small bilateral hip effusions. There is also edema in the external obturator muscles bilaterally.  No bursitis.   The patient has marked hydronephrosis of the right ureter into the pelvis where it appears obstructed several cm above the bladder by circumferential soft tissue thickening of the distal right ureter. The bladder is trabeculated. Extensive diverticulosis of the distal colon.  IMPRESSION: 1. Focal inflammation of the distal left gluteus medius tendon at the left greater trochanter with atrophy of the left gluteus medius and minimus muscles. 2. Diffuse small metastases throughout the visualized portion of the skeleton. No pathologic fractures or stress fractures. 3. Hydronephrosis of the right renal collecting system with obstruction of the ureter several cm above the bladder.   Electronically Signed   By: Geanie Cooley M.D.   On: 08/27/2013 16:08      IMPRESSION: 79 year old gentleman with metastatic prostate cancer and left hip pain  PLAN: I spoke to the patient and his son today. He does have pain but given his low burden of metastatic disease in the absence of a large lesion causing his pain I think this is likely more orthopedic in nature.  His degenerative disc disease and spinal stenosis could be contributing to this or the inflammation seen in his gluteus medius tendon. I explained to him and his husband that I did not believe radiation would benefit as I did not see a clear target causing his pain. He has actually a high number of very small lesions but none of them I believe is the cause of his pain. I encouraged him to followup with orthopedic surgery and I will give Dr. Dorena Bodo a call regarding this. He has followup scheduled with medical oncology. I would be happy to see him back on an as-needed basis.  I spent 20 minutes  face to face with the patient and more than 50% of that time was spent in counseling and/or coordination of care.   ------------------------------------------------  Lurline Hare, MD

## 2013-08-28 NOTE — Progress Notes (Signed)
Please see the Nurse Progress Note in the MD Initial Consult Encounter for this patient. 

## 2013-08-28 NOTE — Addendum Note (Signed)
Encounter addended by: Delynn Flavin, RN on: 08/28/2013  6:33 PM<BR>     Documentation filed: Charges VN

## 2013-08-28 NOTE — Progress Notes (Signed)
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 MRI OF THE LEFT HIP WITHOUT CONTRAST  FINDINGS: There are numerous tiny metastatic lesions throughout the visualized bones of the pelvis and hips and the lumbar spine. There is no evidence of a stress reaction or stress fracture. There is inflammation of the distal left gluteus medius tendon and there is atrophy of the left gluteus medius and minimus muscles.    Past/Anticipated chemotherapy by medical oncology, if any:No Pain on a scale of 0-10 is: Increasing left hip pain for 2 months, "0" sitting "5" on ambulating   If Spine Met(s), symptoms, if any, include:  Bowel/Bladder retention or incontinence Intact , takes stool softener daily  Numbness or weakness in extremities/no numbness, walks with cane or walker  Current Decadron regimen, if applicable: No  Ambulatory with cane, has walker for last 6 months.Ambulates dog 6 times daily.  SAFETY ISSUES:  Prior radiation?Yes   Yes, Dr. Dan Humphreys  6500 cGy radiation  01/21/92-03/10/92 Prostate Cancer  Pacemaker/ICD? no   Is the patient on methotrexate? no Current Complaints / other details:  PSA 05/07/13=340.70, HX 'S:Diastolic heart failure, A-Fib, V-Tach, focal seizures,HTN, hearing loss both ears (wears hearing aids) Patient here with son from [place].Delightful and alert.

## 2013-08-31 NOTE — Addendum Note (Signed)
Encounter addended by: Tessa Lerner, RN on: 08/31/2013 10:31 AM<BR>     Documentation filed: Charges VN

## 2013-09-18 NOTE — Progress Notes (Signed)
[facility] Psychosocial Distress Screening Clinical Social Work  Clinical Social Work was referred by distress screening protocol.  The patient scored a 5 on the Psychosocial Distress Thermometer which indicates moderate distress. Clinical Social Worker Intern telephoned to assess for distress and other psychosocial needs. Patient stated that he was doing "fine".  Patient reported he was hard of hearing and could not understand what was being said but that he had an upcoming appointment.  CSWI encouraged Patient to contact SW if anything was needed.   Clinical Social Worker follow up needed: no  If yes, follow up plan:   Zabrina Brotherton S. [facility] Clinical Social Work Intern [facility] [facility] [phone]

## 2013-09-24 ENCOUNTER — Other Ambulatory Visit: Payer: Self-pay | Admitting: Cardiology

## 2013-09-24 ENCOUNTER — Other Ambulatory Visit: Payer: Self-pay

## 2013-09-29 ENCOUNTER — Telehealth: Payer: Self-pay | Admitting: *Deleted

## 2013-09-29 ENCOUNTER — Other Ambulatory Visit: Payer: Self-pay | Admitting: *Deleted

## 2013-09-29 ENCOUNTER — Telehealth: Payer: Self-pay | Admitting: Oncology

## 2013-09-29 NOTE — Telephone Encounter (Signed)
Patient calling to say he will be in [place] at the appt time, gave him the # for cheryl, new patient coordinator, to change appt .

## 2013-09-29 NOTE — Telephone Encounter (Signed)
Spoke with son mark, per dr Clelia Croft, on 10/02/13 patient will have lab @ 11:30 and dr visit @ 12:00.

## 2013-09-29 NOTE — Telephone Encounter (Signed)
lvm for son @ [phone] appts on Friday

## 2013-10-01 NOTE — Telephone Encounter (Signed)
No note

## 2013-10-02 ENCOUNTER — Inpatient Hospital Stay (HOSPITAL_COMMUNITY)
Admission: EM | Admit: 2013-10-02 | Discharge: 2013-10-04 | DRG: 552 | Disposition: A | Discharge status: Home / Self Care | Payer: Medicare Other | Attending: Internal Medicine | Admitting: Internal Medicine

## 2013-10-02 ENCOUNTER — Other Ambulatory Visit: Payer: Medicare Other | Admitting: Lab

## 2013-10-02 ENCOUNTER — Other Ambulatory Visit: Payer: Medicare Other

## 2013-10-02 ENCOUNTER — Encounter (HOSPITAL_COMMUNITY): Payer: Self-pay | Admitting: Emergency Medicine

## 2013-10-02 ENCOUNTER — Ambulatory Visit: Payer: Medicare Other | Admitting: Oncology

## 2013-10-02 DIAGNOSIS — I10 Essential (primary) hypertension: Secondary | ICD-10-CM

## 2013-10-02 DIAGNOSIS — I4891 Unspecified atrial fibrillation: Secondary | ICD-10-CM

## 2013-10-02 DIAGNOSIS — E871 Hypo-osmolality and hyponatremia: Secondary | ICD-10-CM

## 2013-10-02 DIAGNOSIS — C61 Malignant neoplasm of prostate: Secondary | ICD-10-CM | POA: Diagnosis present

## 2013-10-02 DIAGNOSIS — E860 Dehydration: Secondary | ICD-10-CM

## 2013-10-02 DIAGNOSIS — I5032 Chronic diastolic (congestive) heart failure: Secondary | ICD-10-CM

## 2013-10-02 DIAGNOSIS — R5381 Other malaise: Secondary | ICD-10-CM | POA: Diagnosis present

## 2013-10-02 DIAGNOSIS — N189 Chronic kidney disease, unspecified: Secondary | ICD-10-CM | POA: Diagnosis present

## 2013-10-02 DIAGNOSIS — M549 Dorsalgia, unspecified: Secondary | ICD-10-CM

## 2013-10-02 DIAGNOSIS — M545 Low back pain: Secondary | ICD-10-CM

## 2013-10-02 LAB — CBC
HCT: 39.8 % (ref 39.0–52.0)
Hemoglobin: 14.1 g/dL (ref 13.0–17.0)
MCH: 32.4 pg (ref 26.0–34.0)
MCHC: 35.4 g/dL (ref 30.0–36.0)
RDW: 13.6 % (ref 11.5–15.5)
WBC: 7.5 10*3/uL (ref 4.0–10.5)

## 2013-10-02 LAB — CBC WITH DIFFERENTIAL/PLATELET
Basophils Relative: 0 % (ref 0–1)
HCT: 39.2 % (ref 39.0–52.0)
Hemoglobin: 13.5 g/dL (ref 13.0–17.0)
Lymphocytes Relative: 7 % — ABNORMAL LOW (ref 12–46)
Lymphs Abs: 0.6 10*3/uL — ABNORMAL LOW (ref 0.7–4.0)
MCH: 31.5 pg (ref 26.0–34.0)
MCHC: 34.4 g/dL (ref 30.0–36.0)
MCV: 91.4 fL (ref 78.0–100.0)
Monocytes Absolute: 0.9 10*3/uL (ref 0.1–1.0)
Monocytes Relative: 10 % (ref 3–12)
Neutro Abs: 7.5 10*3/uL (ref 1.7–7.7)
RBC: 4.29 MIL/uL (ref 4.22–5.81)
RDW: 13.6 % (ref 11.5–15.5)
WBC: 9 10*3/uL (ref 4.0–10.5)

## 2013-10-02 LAB — BASIC METABOLIC PANEL
BUN: 27 mg/dL — ABNORMAL HIGH (ref 6–23)
Calcium: 8.7 mg/dL (ref 8.4–10.5)
Chloride: 96 mEq/L (ref 96–112)
Creatinine, Ser: 1.35 mg/dL (ref 0.50–1.35)
GFR calc Af Amer: 51 mL/min — ABNORMAL LOW (ref >90)
GFR calc non Af Amer: 44 mL/min — ABNORMAL LOW (ref >90)
Glucose, Bld: 146 mg/dL — ABNORMAL HIGH (ref 70–99)
Potassium: 4.9 mEq/L (ref 3.5–5.1)

## 2013-10-02 LAB — URINALYSIS, ROUTINE W REFLEX MICROSCOPIC
Bilirubin Urine: NEGATIVE
Glucose, UA: NEGATIVE mg/dL
Hgb urine dipstick: NEGATIVE
Ketones, ur: NEGATIVE mg/dL
Specific Gravity, Urine: 1.019 (ref 1.005–1.030)
Urobilinogen, UA: 1 mg/dL (ref 0.0–1.0)
pH: 7 (ref 5.0–8.0)

## 2013-10-02 LAB — POCT I-STAT, CHEM 8
Chloride: 97 mEq/L (ref 96–112)
Creatinine, Ser: 1.5 mg/dL — ABNORMAL HIGH (ref 0.50–1.35)
Glucose, Bld: 96 mg/dL (ref 70–99)
Potassium: 5.2 mEq/L — ABNORMAL HIGH (ref 3.5–5.1)
Sodium: 129 mEq/L — ABNORMAL LOW (ref 135–145)

## 2013-10-02 MED ORDER — ZONISAMIDE 100 MG PO CAPS
200.0000 mg | ORAL_CAPSULE | Freq: Every day | ORAL | Status: DC
Start: 2013-10-02 — End: 2013-10-04
  Administered 2013-10-02 – 2013-10-03 (×2): 200 mg via ORAL
  Filled 2013-10-02 (×4): qty 2

## 2013-10-02 MED ORDER — ATORVASTATIN CALCIUM 10 MG PO TABS
10.0000 mg | ORAL_TABLET | Freq: Every day | ORAL | Status: DC
  Administered 2013-10-03: 10 mg via ORAL
  Filled 2013-10-02 (×3): qty 1

## 2013-10-02 MED ORDER — FENTANYL CITRATE 0.05 MG/ML IJ SOLN
50.0000 ug | Freq: Once | INTRAMUSCULAR | Status: AC
  Administered 2013-10-02: 50 ug via INTRAVENOUS
  Filled 2013-10-02: qty 2

## 2013-10-02 MED ORDER — ONDANSETRON HCL 4 MG/2ML IJ SOLN: 4.0000 mg | Freq: Four times a day (QID) | INTRAMUSCULAR | Status: DC | PRN

## 2013-10-02 MED ORDER — ONE-DAILY MULTI VITAMINS PO TABS: 1.0000 | ORAL_TABLET | Freq: Every day | ORAL | Status: DC

## 2013-10-02 MED ORDER — BENAZEPRIL HCL 10 MG PO TABS
10.0000 mg | ORAL_TABLET | Freq: Every day | ORAL | Status: DC
  Filled 2013-10-02: qty 1

## 2013-10-02 MED ORDER — ACETAMINOPHEN 325 MG PO TABS
650.0000 mg | ORAL_TABLET | Freq: Once | ORAL | Status: AC
  Administered 2013-10-02: 650 mg via ORAL
  Filled 2013-10-02: qty 2

## 2013-10-02 MED ORDER — LATANOPROST 0.005 % OP SOLN
1.0000 [drp] | Freq: Every day | OPHTHALMIC | Status: DC
  Administered 2013-10-02 – 2013-10-03 (×2): 1 [drp] via OPHTHALMIC
  Filled 2013-10-02: qty 2.5

## 2013-10-02 MED ORDER — DIAZEPAM 5 MG PO TABS
5.0000 mg | ORAL_TABLET | Freq: Once | ORAL | Status: AC
  Administered 2013-10-02: 5 mg via ORAL
  Filled 2013-10-02: qty 1

## 2013-10-02 MED ORDER — HYDROCODONE-ACETAMINOPHEN 5-325 MG PO TABS
1.0000 | ORAL_TABLET | ORAL | Status: DC | PRN
  Administered 2013-10-03: 1 via ORAL
  Filled 2013-10-02: qty 1

## 2013-10-02 MED ORDER — AMIODARONE HCL 100 MG PO TABS
100.0000 mg | ORAL_TABLET | Freq: Every day | ORAL | Status: DC
  Administered 2013-10-03: 100 mg via ORAL
  Filled 2013-10-02 (×3): qty 1

## 2013-10-02 MED ORDER — ONDANSETRON HCL 4 MG PO TABS: 4.0000 mg | ORAL_TABLET | Freq: Four times a day (QID) | ORAL | Status: DC | PRN

## 2013-10-02 MED ORDER — MORPHINE SULFATE 2 MG/ML IJ SOLN: 2.0000 mg | INTRAMUSCULAR | Status: DC | PRN

## 2013-10-02 MED ORDER — BICALUTAMIDE 50 MG PO TABS
50.0000 mg | ORAL_TABLET | Freq: Every day | ORAL | Status: DC
  Administered 2013-10-03: 50 mg via ORAL
  Filled 2013-10-02 (×3): qty 1

## 2013-10-02 MED ORDER — LIDOCAINE 5 % EX PTCH
2.0000 | MEDICATED_PATCH | CUTANEOUS | Status: DC
  Administered 2013-10-02: 2 via TRANSDERMAL
  Filled 2013-10-02 (×3): qty 2

## 2013-10-02 MED ORDER — NITROGLYCERIN 0.4 MG SL SUBL: 0.4000 mg | SUBLINGUAL_TABLET | SUBLINGUAL | Status: DC | PRN

## 2013-10-02 MED ORDER — ENOXAPARIN SODIUM 40 MG/0.4ML ~~LOC~~ SOLN
40.0000 mg | SUBCUTANEOUS | Status: DC
  Administered 2013-10-02 – 2013-10-03 (×2): 40 mg via SUBCUTANEOUS
  Filled 2013-10-02 (×4): qty 0.4

## 2013-10-02 MED ORDER — DOCUSATE SODIUM 100 MG PO CAPS
100.0000 mg | ORAL_CAPSULE | Freq: Two times a day (BID) | ORAL | Status: DC
  Administered 2013-10-02 – 2013-10-03 (×3): 100 mg via ORAL
  Filled 2013-10-02 (×4): qty 1

## 2013-10-02 MED ORDER — SODIUM CHLORIDE 0.9 % IV SOLN
INTRAVENOUS | Status: DC
  Administered 2013-10-02: 20:00:00 via INTRAVENOUS

## 2013-10-02 MED ORDER — ASPIRIN EC 81 MG PO TBEC
81.0000 mg | DELAYED_RELEASE_TABLET | ORAL | Status: DC
  Administered 2013-10-03: 81 mg via ORAL
  Filled 2013-10-02: qty 1

## 2013-10-02 MED ORDER — DILTIAZEM HCL ER COATED BEADS 120 MG PO CP24
120.0000 mg | ORAL_CAPSULE | Freq: Every day | ORAL | Status: DC
  Administered 2013-10-03: 120 mg via ORAL
  Filled 2013-10-02 (×3): qty 1

## 2013-10-02 MED ORDER — ADULT MULTIVITAMIN W/MINERALS CH
1.0000 | ORAL_TABLET | Freq: Every day | ORAL | Status: DC
  Administered 2013-10-03: 1 via ORAL
  Filled 2013-10-02 (×3): qty 1

## 2013-10-02 MED ORDER — ONDANSETRON HCL 4 MG/2ML IJ SOLN
4.0000 mg | Freq: Once | INTRAMUSCULAR | Status: AC
  Administered 2013-10-02: 4 mg via INTRAVENOUS
  Filled 2013-10-02: qty 2

## 2013-10-02 NOTE — Progress Notes (Signed)
10/02/13 4:23 W. Shayla Heming RN BSN [phone] Pt being admitted for for obs for intractable pain. No further ED CM needs identified  10/02/13 3:02 Beecher Mcardle RN BSN [phone] ED CM consulted by Dr. Anitra Lauth to meet with patient regarding HH.  Pt present to ED with back pain. Pt has history of Bone Ca.with mets. Pt reports, he lives alone in an independent living facility, and son is here from out of town. He states, he is not able to change positions without pain 10/10. He has become immobile due to pain. Discussed HH options, he states he would be willing to discuss that at another time. Discussed meeting with, [place] further evaluation of pain needed. CM will continue to follow for dispositon.

## 2013-10-02 NOTE — ED Notes (Signed)
hospitalist MD at bedside. 

## 2013-10-02 NOTE — ED Notes (Signed)
Attempted report 

## 2013-10-02 NOTE — ED Notes (Signed)
MD at bedside. 

## 2013-10-02 NOTE — ED Provider Notes (Addendum)
CSN: [id number]     Arrival date & time 10/02/13  1100 History   First MD Initiated Contact with Patient 10/02/13 1107     Chief Complaint  Patient presents with  . Back Pain   (Consider location/radiation/quality/duration/timing/severity/associated sxs/prior Treatment) Patient is a 79 y.o. male presenting with back pain. The history is provided by the patient.  Back Pain Location:  Lumbar spine Quality:  Stiffness and aching Stiffness is present:  All day Radiates to:  Does not radiate Pain severity:  Severe Pain is:  Same all the time Onset quality:  Gradual Duration:  5 days Timing:  Constant Progression:  Worsening Chronicity:  New (chronic back pain but worse over the last 5 days after injection last week) Relieved by:  Being still (states he has no pain with sitting but when he tries to the move the pain is bad) Worsened by:  Ambulation, bending and movement Associated symptoms: no abdominal pain, no bladder incontinence, no bowel incontinence, no chest pain, no dysuria, no fever, no leg pain, no numbness, no paresthesias, no pelvic pain and no weakness   Risk factors comment:  Hx of bone ca with mets to the spine but no spinal cord involvement   Past Medical History  Diagnosis Date  . HTN (hypertension)   . HLD (hyperlipidemia)   . Ventricular tachycardia, non-sustained 2004  . Diastolic CHF, chronic   . Atrial fibrillation   . Elevated troponin   . Heart murmur   . Seizures   . CHF (congestive heart failure)   . CVA (cerebral infarction)   . Bone cancer 08/27/13 MR    pelvis/and hips and lumbar spine  . Prostate cancer 1993  . Glaucoma    Past Surgical History  Procedure Laterality Date  . Wrist surgery      left/screws  . Cataract extraction      bilat.eyes   Family History  Problem Relation Age of Onset  . Parkinsonism Father    History  Substance Use Topics  . Smoking status: Never Smoker   . Smokeless tobacco: Never Used  . Alcohol Use: Yes   Comment: socially    Review of Systems  Constitutional: Negative for fever.  Cardiovascular: Negative for chest pain.  Gastrointestinal: Negative for abdominal pain and bowel incontinence.  Genitourinary: Negative for bladder incontinence, dysuria and pelvic pain.  Musculoskeletal: Positive for back pain.  Neurological: Negative for weakness, numbness and paresthesias.  All other systems reviewed and are negative.    Allergies  Review of patient's allergies indicates no known allergies.  Home Medications   Current Outpatient Rx  Name  Route  Sig  Dispense  Refill  . amiodarone (PACERONE) 200 MG tablet   Oral   Take 100 mg by mouth daily.         Marland Kitchen aspirin EC 81 MG tablet   Oral   Take 81 mg by mouth every other day.          Marland Kitchen atorvastatin (LIPITOR) 10 MG tablet   Oral   Take 10 mg by mouth daily.         . benazepril (LOTENSIN) 10 MG tablet   Oral   Take 1 tablet (10 mg total) by mouth daily.   90 tablet   3   . bicalutamide (CASODEX) 50 MG tablet   Oral   Take 50 mg by mouth daily.         . bimatoprost (LUMIGAN) 0.03 % ophthalmic drops   Both Eyes  Place 1 drop into both eyes at bedtime.          Marland Kitchen diltiazem (CARDIZEM CD) 120 MG 24 hr capsule   Oral   Take 1 capsule (120 mg total) by mouth daily.   90 capsule   3   . docusate sodium (COLACE) 100 MG capsule   Oral   Take 100 mg by mouth every morning. daily         . Multiple Vitamin (MULTIVITAMIN) tablet   Oral   Take 1 tablet by mouth daily.           Marland Kitchen zonisamide (ZONEGRAN) 100 MG capsule   Oral   Take 2 capsules (200 mg total) by mouth daily.   180 capsule   3     Three month supply requested by the patient.   Marland Kitchen NITROSTAT 0.4 MG SL tablet      DISSOLVE 1 TABLET UNDER THETONGUE EVERY 5 MINUTES AS  NEEDED   100 tablet   3    BP 143/83  Pulse 60  Temp(Src) 97.8 F (36.6 C) (Oral)  Resp 18  SpO2 98% Physical Exam  Nursing note and vitals reviewed. Constitutional: He  is oriented to person, place, and time. He appears well-developed and well-nourished. No distress.  HENT:  Head: Normocephalic and atraumatic.  Mouth/Throat: Oropharynx is clear and moist.  Eyes: Conjunctivae and EOM are normal. Pupils are equal, round, and reactive to light.  Neck: Normal range of motion. Neck supple.  Cardiovascular: Normal rate and intact distal pulses.  An irregularly irregular rhythm present.  Murmur heard. Pulmonary/Chest: Effort normal and breath sounds normal. No respiratory distress. He has no wheezes. He has no rales.  Abdominal: Soft. He exhibits no distension. There is no tenderness. There is no rebound and no guarding.  Musculoskeletal: Normal range of motion. He exhibits edema.       Lumbar back: He exhibits tenderness, pain and spasm. He exhibits no bony tenderness, no deformity and normal pulse.  2+ pitting edema bilaterally R>L  Neurological: He is alert and oriented to person, place, and time.  Skin: Skin is warm and dry. No rash noted. No erythema.  Psychiatric: He has a normal mood and affect. His behavior is normal.    ED Course  Procedures (including critical care time) Labs Review Labs Reviewed  CBC WITH DIFFERENTIAL - Abnormal; Notable for the following:    Neutrophils Relative % 83 (*)    Lymphocytes Relative 7 (*)    Lymphs Abs 0.6 (*)    All other components within normal limits  POCT I-STAT, CHEM 8 - Abnormal; Notable for the following:    Sodium 129 (*)    Potassium 5.2 (*)    BUN 28 (*)    Creatinine, Ser 1.50 (*)    All other components within normal limits  URINALYSIS, ROUTINE W REFLEX MICROSCOPIC   Imaging Review No results found.  EKG Interpretation   None       MDM   1. Back pain     Patient presents today with worsening lumbar back pain which is related to movement and bending. He lives independently and is having difficulty getting out of bed and walking with his walker due to the pain. Patient has had recent MRI  in the last month that showed multiple bony metastases but no spinal cord involvement. Patient has seen radiation oncology about this and they do not feel the metastases in the cause of his pain. He had person injection last  week without improvement in his symptoms. He denies any radicular symptoms at this time and has normal strength bowel and bladder control. Patient currently was only taking ibuprofen when necessary for pain. Will Attempt 5 mg of Valium to see if that will help with his pain and Tylenol.  CBC and i-STAT are unchanged from prior. UA pending to ensure no signs of infection as a cause of worsening pain.  1:20 PM UA neg for infection.  Will attempt to get pt out of bed and ambulate.  1:34 PM Pt was able to get up with severe pain and unable to walk.  Still having pain despite meds.  Spoke with case management and pt is not a candidate for 24 hour care in his independent living.  Will admit for pain contorl. Gwyneth Sprout, MD 10/02/13 1334  Gwyneth Sprout, MD 10/02/13 1530  Gwyneth Sprout, MD 10/02/13 1610

## 2013-10-02 NOTE — H&P (Signed)
History and Physical Examination  CIRILO CANNER MVH:[id number] DOB: September 13, 1920 DOA: 10/02/2013  Referring physician: Anitra Lauth, MD PCP: Florentina Jenny, MD   Chief Complaint: severe low back pain   HPI: Henry Meyers is a 79 y.o. male with a long history of metastatic prostate cancer and chronic low back pain presented to the emergency department after seeing his physican earlier today for control of his severe low back pain.  The patient reports that his pain has persistently gotten worse over the past 5 days after he had a steroid injection in the lumbar spine by his orthopedic surgeon.  The patient had also been seen recently by radiation oncology for evaluation of possible palliative treatment.  It was decided by radiation oncology that the multiple small areas of metastatic disease was not likely the cause of his back pain but that it was likely related to degeneration in the lumbar spine as a primary cause of his low back pain.  The patient was seen in the emergency department and given pain medication and Valium with no significant improvement.  The patient has been having a difficult time ambulating at home.  He is not able to walk his dog which he had been doing 6 times per day prior to this recent exacerbation.  The patient reports no loss of bowel or bladder control.  Because the patient had been living independently he was not a candidate for 24-hour assistance at an assisted-living facility.  He currently isn't too much pain to be able to go home alone.  The patient had a recent MRI study of his painful hip and lumbar spine that revealed Focal inflammation of the distal left gluteus medius tendon at the left greater trochanter with atrophy of the left gluteus medius and minimus muscles and Diffuse small metastases throughout the visualized portion of the skeleton. No pathologic fractures or stress fractures.  The lumbar spine reveals severe spinal stenosis at L3-L4.  A hospitalization was requested  for pain management of this intractable low back pain.   Past Medical History Past Medical History  Diagnosis Date  . HTN (hypertension)   . HLD (hyperlipidemia)   . Ventricular tachycardia, non-sustained 2004  . Diastolic CHF, chronic   . Atrial fibrillation   . Elevated troponin   . Heart murmur   . Seizures   . CHF (congestive heart failure)   . CVA (cerebral infarction)   . Bone cancer 08/27/13 MR    pelvis/and hips and lumbar spine  . Prostate cancer 1993  . Glaucoma    Past Surgical History Past Surgical History  Procedure Laterality Date  . Wrist surgery      left/screws  . Cataract extraction      bilat.eyes   Home Meds: Prior to Admission medications   Medication Sig Start Date End Date Taking? Authorizing Provider  amiodarone (PACERONE) 200 MG tablet Take 100 mg by mouth daily.   Yes Historical Provider, MD  aspirin EC 81 MG tablet Take 81 mg by mouth every other day.  05/05/13  Yes Laurey Morale, MD  atorvastatin (LIPITOR) 10 MG tablet Take 10 mg by mouth daily.   Yes Historical Provider, MD  benazepril (LOTENSIN) 10 MG tablet Take 1 tablet (10 mg total) by mouth daily. 07/30/12  Yes Laurey Morale, MD  bicalutamide (CASODEX) 50 MG tablet Take 50 mg by mouth daily.   Yes Historical Provider, MD  bimatoprost (LUMIGAN) 0.03 % ophthalmic drops Place 1 drop into both eyes at bedtime.  Yes Historical Provider, MD  diltiazem (CARDIZEM CD) 120 MG 24 hr capsule Take 1 capsule (120 mg total) by mouth daily. 08/24/13  Yes Laurey Morale, MD  docusate sodium (COLACE) 100 MG capsule Take 100 mg by mouth every morning. daily   Yes Historical Provider, MD  Multiple Vitamin (MULTIVITAMIN) tablet Take 1 tablet by mouth daily.     Yes Historical Provider, MD  zonisamide (ZONEGRAN) 100 MG capsule Take 2 capsules (200 mg total) by mouth daily. 07/28/12  Yes Milas Gain, MD  NITROSTAT 0.4 MG SL tablet DISSOLVE 1 TABLET UNDER THETONGUE EVERY 5 MINUTES AS  NEEDED 07/03/13   Laurey Morale, MD   Allergies: Review of patient's allergies indicates no known allergies.  Social History:  History   Social History  . Marital Status: Widowed    Spouse Name: N/A    Number of Children: 2  . Years of Education: 14+   Occupational History  . retired    Social History Main Topics  . Smoking status: Never Smoker   . Smokeless tobacco: Never Used  . Alcohol Use: Yes     Comment: socially  . Drug Use: No  . Sexual Activity: No   Other Topics Concern  . Not on file   Social History Narrative   Lives at [facility]   Retired Passenger transport manager, worked for [employer]     Has two sons; one lives in [place] and one in [place].  Does not have any family in [place], but does have lots of friends here.   Family History:  Family History  Problem Relation Age of Onset  . Parkinsonism Father     Review of Systems:  Positive for difficulty walking, and low back pain and left hip buttock pain.  The patient denies anorexia, fever, weight loss,, vision loss, decreased hearing, hoarseness, chest pain, syncope, dyspnea on exertion, peripheral edema, balance deficits, hemoptysis, abdominal pain, melena, hematochezia, severe indigestion/heartburn, hematuria, incontinence, genital sores, muscle weakness, suspicious skin lesions, transient blindness, and and  depression, unusual weight change, abnormal bleeding, enlarged lymph nodes, angioedema, and breast masses.   All other systems reviewed and reported as negative.   Physical Exam: Blood pressure 178/68, pulse 66, temperature 98.2 F (36.8 C), temperature source Oral, resp. rate 18, SpO2 98.00%. General appearance: alert, appears older than stated age and delirious Head: Normocephalic, without obvious abnormality, atraumatic Nose: Nares normal. Septum midline. Mucosa normal. No drainage or sinus tenderness., no discharge Throat: dry mucus membranes Neck: no adenopathy, no carotid  bruit, no JVD, supple, symmetrical, trachea midline and thyroid not enlarged, symmetric, no tenderness/mass/nodules Back: tenderness of lumbar spine and left buttock Lungs: clear to auscultation bilaterally Chest wall: no tenderness Heart: S1, S2 normal Abdomen: soft, non-tender; bowel sounds normal; no masses,  no organomegaly Extremities: extremities normal, atraumatic, no cyanosis or edema Neurologic: Grossly normal  Lab  And Imaging results:  Results for orders placed during the hospital encounter of 10/02/13 (from the past 24 hour(s))  CBC WITH DIFFERENTIAL     Status: Abnormal   Collection Time    10/02/13 11:39 AM      Result Value Range   WBC 9.0  4.0 - 10.5 K/uL   RBC 4.29  4.22 - 5.81 MIL/uL   Hemoglobin 13.5  13.0 - 17.0 g/dL   HCT 40.9  81.1 - 91.4 %   MCV 91.4  78.0 - 100.0 fL   MCH 31.5  26.0 -  34.0 pg   MCHC 34.4  30.0 - 36.0 g/dL   RDW 96.0  45.4 - 09.8 %   Platelets 180  150 - 400 K/uL   Neutrophils Relative % 83 (*) 43 - 77 %   Neutro Abs 7.5  1.7 - 7.7 K/uL   Lymphocytes Relative 7 (*) 12 - 46 %   Lymphs Abs 0.6 (*) 0.7 - 4.0 K/uL   Monocytes Relative 10  3 - 12 %   Monocytes Absolute 0.9  0.1 - 1.0 K/uL   Eosinophils Relative 0  0 - 5 %   Eosinophils Absolute 0.0  0.0 - 0.7 K/uL   Basophils Relative 0  0 - 1 %   Basophils Absolute 0.0  0.0 - 0.1 K/uL  POCT I-STAT, CHEM 8     Status: Abnormal   Collection Time    10/02/13 12:13 PM      Result Value Range   Sodium 129 (*) 135 - 145 mEq/L   Potassium 5.2 (*) 3.5 - 5.1 mEq/L   Chloride 97  96 - 112 mEq/L   BUN 28 (*) 6 - 23 mg/dL   Creatinine, Ser 1.19 (*) 0.50 - 1.35 mg/dL   Glucose, Bld 96  70 - 99 mg/dL   Calcium, Ion 1.47  8.29 - 1.30 mmol/L   TCO2 22  0 - 100 mmol/L   Hemoglobin 13.9  13.0 - 17.0 g/dL   HCT 56.2  13.0 - 86.5 %  URINALYSIS, ROUTINE W REFLEX MICROSCOPIC     Status: None   Collection Time    10/02/13 12:16 PM      Result Value Range   Color, Urine YELLOW  YELLOW   APPearance  CLEAR  CLEAR   Specific Gravity, Urine 1.019  1.005 - 1.030   pH 7.0  5.0 - 8.0   Glucose, UA NEGATIVE  NEGATIVE mg/dL   Hgb urine dipstick NEGATIVE  NEGATIVE   Bilirubin Urine NEGATIVE  NEGATIVE   Ketones, ur NEGATIVE  NEGATIVE mg/dL   Protein, ur NEGATIVE  NEGATIVE mg/dL   Urobilinogen, UA 1.0  0.0 - 1.0 mg/dL   Nitrite NEGATIVE  NEGATIVE   Leukocytes, UA NEGATIVE  NEGATIVE   Impression / Plan  Intractable low back pain /  Physical deconditioning - Will admit to med surg bed for pain management with vicodin, morphine (for severe pain), lidoderm patches - consult PT/OT for eval and treatment - Fall precautions - stool softeners   Hyponatremia / Dehydration -Pt is clinically dehydrated, will start gentle IVFs with NS, follow lytes, check TSH  Hyperkalemia  - suspect this was a hemolyzed sample, will repeat BMP    Malignant neoplasm of prostate metastatic to bone - recent MRI studies do not suggest that metastatic disease is primary cause of low back pain and likely pt would not benefit from radiation treatment - follow up with Dr Clelia Croft   Atrial Fibrillation,  - currently stable, controlled with meds - resume home amiodarone, diltiazem  Hypertension - resume home meds, follow -likely elevated because of uncontrolled pain  CKD - Pt's creatinine is holding stable at 1.5 - Follow electrolytes   Dyslipidemia - resume home atorvastatin  CHF - stable, compensated, - monitor I/O, gentle fluids  Glaucoma - resume home meds  I attempted to contact pt's son but was not able to get him on telephone.   Standley Dakins MD [facility] [place], [place] [phone] 10/02/2013, 5:00 PM

## 2013-10-02 NOTE — ED Notes (Signed)
Per PTAR pt came from doctors office where he was being seen for chronic back pain exacerbation. Pt sent here for pain management.

## 2013-10-03 ENCOUNTER — Observation Stay (HOSPITAL_COMMUNITY): Payer: Medicare Other

## 2013-10-03 DIAGNOSIS — N189 Chronic kidney disease, unspecified: Secondary | ICD-10-CM

## 2013-10-03 DIAGNOSIS — I5032 Chronic diastolic (congestive) heart failure: Secondary | ICD-10-CM

## 2013-10-03 LAB — BASIC METABOLIC PANEL
BUN: 28 mg/dL — ABNORMAL HIGH (ref 6–23)
CO2: 22 mEq/L (ref 19–32)
Calcium: 8.6 mg/dL (ref 8.4–10.5)
Creatinine, Ser: 1.37 mg/dL — ABNORMAL HIGH (ref 0.50–1.35)
GFR calc Af Amer: 50 mL/min — ABNORMAL LOW (ref >90)
GFR calc non Af Amer: 43 mL/min — ABNORMAL LOW (ref >90)
Glucose, Bld: 95 mg/dL (ref 70–99)
Potassium: 5.1 mEq/L (ref 3.5–5.1)

## 2013-10-03 NOTE — Progress Notes (Signed)
[facility] PROGRESS NOTE  Henry Meyers RUE:[id number] DOB: Apr 16, 1920 DOA: 10/02/2013 PCP: Florentina Jenny, MD  Assessment/Plan: 1.Back pain: worse over last week. Worse with movement. He is feeling some what better. Is difficult to stand up. Will order PT evaluation. X ray show moderate T 6 compression fracture. Unclear chronicity. His pain is more lower in the lumbar spine. Pain management.   2-Hydronephrosis: US show  Marked right hydronephrosis and hydroureter. Will need to follow up with Dr Lynnae Sandhoff. Son does not want aggressive procedure.  3-Mild increase K: hold ace.  4-Mild renal insufficiency: hold ACE. Improved with IV fluids.  5-Atrial Fibrillation,  currently stable, controlled with meds. resume home amiodarone, diltiazem.   Code Status: Full Code.  Family Communication: Care discussed with son.  Disposition Plan: Waiting PT evaluation.    Consultants:  none  Procedures: Renal US: 1. Marked right hydronephrosis and hydroureter.  2. Renal parenchymal thinning on the right, consistent with  long-standing hydronephrosis. The hydronephrosis appears new since  the CT exam performed in 2012.  3. Normal appearance of the left kidney and bladder.  4. Incidental note of calcified splenic granulomata.     Antibiotics:  none  HPI/Subjective: Patient relates lower abdominal pain. Worse with movement.   Objective: Filed Vitals:   10/03/13 0456  BP: 152/70  Pulse: 59  Temp: 98 F (36.7 C)  Resp: 16    Intake/Output Summary (Last 24 hours) at 10/03/13 1334 Last data filed at 10/03/13 1000  Gross per 24 hour  Intake    515 ml  Output   1100 ml  Net   -585 ml   There were no vitals filed for this visit.  Exam:   General:  No distress.   Cardiovascular: S 1, S 2 RRR  Respiratory: CTA  Abdomen: bs present, soft, NT  Musculoskeletal: no edema.   Data Reviewed: Basic Metabolic Panel:  Recent Labs Lab 10/02/13 1213 10/02/13 1920 10/03/13 0625   NA 129* 129* 133*  K 5.2* 4.9 5.1  CL 97 96 100  CO2  --  23 22  GLUCOSE 96 146* 95  BUN 28* 27* 28*  CREATININE 1.50* 1.35 1.37*  CALCIUM  --  8.7 8.6   Liver Function Tests: No results found for this basename: AST, ALT, ALKPHOS, BILITOT, PROT, ALBUMIN,  in the last 168 hours No results found for this basename: LIPASE, AMYLASE,  in the last 168 hours No results found for this basename: AMMONIA,  in the last 168 hours CBC:  Recent Labs Lab 10/02/13 1139 10/02/13 1213 10/02/13 1920  WBC 9.0  --  7.5  NEUTROABS 7.5  --   --   HGB 13.5 13.9 14.1  HCT 39.2 41.0 39.8  MCV 91.4  --  91.5  PLT 180  --  168   Cardiac Enzymes: No results found for this basename: CKTOTAL, CKMB, CKMBINDEX, TROPONINI,  in the last 168 hours BNP (last 3 results)  Recent Labs  05/05/13 1510  PROBNP 270.0*   CBG: No results found for this basename: GLUCAP,  in the last 168 hours  No results found for this or any previous visit (from the past 240 hour(s)).   Studies: Dg Thoracic Spine 2 View  10/03/2013   CLINICAL DATA:  Back pain.  EXAM: THORACIC SPINE - 2 VIEW  COMPARISON:  None.  FINDINGS: Moderate compression fracture of T6 of unclear chronicity. Minor loss of height at T9, T10 and T11. No other evidence of a fracture. No spondylolisthesis. There  are disk degenerative changes along the mid and lower thoracic spine most prominent at T12-L1. The bones are diffusely demineralized. The soft tissues are unremarkable.  IMPRESSION: Moderate T6 compression fracture of unclear chronicity. Degenerative changes and diffuse bony demineralization as detailed.   Electronically Signed   By: Amie Portland M.D.   On: 10/03/2013 10:14   Dg Lumbar Spine 2-3 Views  10/03/2013   CLINICAL DATA:  Back pain.  EXAM: LUMBAR SPINE - 2-3 VIEW  COMPARISON:  Lumbar MRI, 08/20/2013  FINDINGS: No fracture of the lumbar spine. There is generalized straightening of the normal lumbar lordosis. No spondylolisthesis. There are disk  degenerative changes throughout the lumbar spine with loss of disc height, endplate osteophytes and mild endplate sclerosis.  The bones are extensively demineralized.  Dense calcification is noted along the course of a a normal caliber abdominal aorta.  IMPRESSION: No fracture or acute finding. Extensive degenerative changes unchanged from the recent prior MRI.   Electronically Signed   By: Amie Portland M.D.   On: 10/03/2013 10:15   US Renal  10/03/2013   CLINICAL DATA:  Evaluate for obstruction. Left flank pain. History of hypertension, chronic kidney disease.  EXAM: RENAL/URINARY TRACT ULTRASOUND COMPLETE  COMPARISON:  CT of the abdomen and pelvis on 08/06/2011, lumbar spine MR on 08/20/2013  FINDINGS: Right Kidney  Length: 12.4 cm in length. There is dilatation of the renal pelvis, calices, and proximal ureter. The proximal ureter measures at least 2.2 cm. There is renal parenchymal thinning. No focal mass identified.  Left Kidney  Length: 10.8 cm. Echogenicity within normal limits. No mass or hydronephrosis visualized.  Bladder  The bladder has a normal appearance. The left ureteral jet is visualized. The right ureteral jet is not seen.  Additional findings  Incidental note is made of splenic granulomata.  IMPRESSION: 1. Marked right hydronephrosis and hydroureter. 2. Renal parenchymal thinning on the right, consistent with long-standing hydronephrosis. The hydronephrosis appears new since the CT exam performed in 2012. 3. Normal appearance of the left kidney and bladder. 4. Incidental note of calcified splenic granulomata.   Electronically Signed   By: Rosalie Gums M.D.   On: 10/03/2013 12:53    Scheduled Meds: . amiodarone  100 mg Oral Daily  . aspirin EC  81 mg Oral QODAY  . atorvastatin  10 mg Oral Daily  . bicalutamide  50 mg Oral Daily  . diltiazem  120 mg Oral Daily  . docusate sodium  100 mg Oral BID  . enoxaparin (LOVENOX) injection  40 mg Subcutaneous Q24H  . latanoprost  1 drop Both  Eyes QHS  . lidocaine  2 patch Transdermal Q24H  . multivitamin with minerals  1 tablet Oral Daily  . zonisamide  200 mg Oral Daily   Continuous Infusions: . sodium chloride 60 mL/hr at 10/02/13 2023    Principal Problem:   Intractable low back pain Active Problems:   Hyponatremia   Dehydration   Physical deconditioning   Malignant neoplasm of prostate metastatic to bone   CKD (chronic kidney disease)    Time spent: 25 minutes.     Esley Brooking  [facility] Pager [phone]. If 7PM-7AM, please contact night-coverage at [email or web address], password [facility] 10/03/2013, 1:34 PM  LOS: 1 day

## 2013-10-03 NOTE — Care Management Utilization Note (Signed)
UR completed.    Colman Birdwell Wise Noriko Macari, RN, BSN Phone #[phone]  

## 2013-10-03 NOTE — Evaluation (Signed)
Physical Therapy Evaluation Patient Details Name: Henry Meyers MRN: [id number] DOB: [birth year] Today's Date: 10/03/2013 Time: 1420-1450 PT Time Calculation (min): 30 min  PT Assessment / Plan / Recommendation History of Present Illness  Pt admitted with intractable back pain.  Xray shows moderate T6 comp fx.  Clinical Impression  Pt admitted with intractable back pain.  Xray shows T6 compression fx.  Pt currently with functional limitations due to the deficits listed below (see PT Problem List).  Pt will benefit from skilled PT to increase their independence and safety with mobility to allow discharge to the venue listed below. Recommend ST SNF or home with 24 hr/assist and HHPT, pending availability of home assist.      PT Assessment  Patient needs continued PT services    Follow Up Recommendations  Home health PT;Supervision/Assistance - 24 hour;SNF (SNF or home with 24 hr assist and HHPT)    Does the patient have the potential to tolerate intense rehabilitation      Barriers to Discharge Decreased caregiver support      Equipment Recommendations  None recommended by PT    Recommendations for Other Services     Frequency Min 5X/week    Precautions / Restrictions Precautions Precautions: Fall   Pertinent Vitals/Pain 4/10 (faces)      Mobility  Bed Mobility Bed Mobility: Not assessed Transfers Sit to Stand: 4: Min guard;From chair/3-in-1;With upper extremity assist;With armrests Stand to Sit: 4: Min guard;With armrests;With upper extremity assist;To chair/3-in-1 Ambulation/Gait Ambulation/Gait Assistance: 4: Min guard Ambulation Distance (Feet): 150 Feet Assistive device: Rolling walker Gait Pattern: Trunk flexed;Right flexed knee in stance;Left flexed knee in stance;Decreased stride length;Trunk rotated posteriorly on right Gait velocity: decreased    Exercises     PT Diagnosis: Difficulty walking;Acute pain;Generalized weakness  PT Problem List: Decreased  strength;Decreased activity tolerance;Decreased balance;Decreased mobility;Pain PT Treatment Interventions: DME instruction;Gait training;Functional mobility training;Therapeutic activities;Therapeutic exercise;Balance training;Patient/family education     PT Goals(Current goals can be found in the care plan section) Acute Rehab PT Goals Patient Stated Goal: return to his apartment PT Goal Formulation: With patient Time For Goal Achievement: 10/10/13 Potential to Achieve Goals: Good  Visit Information  Last PT Received On: 10/03/13 Assistance Needed: +1 PT/OT Co-Evaluation/Treatment: Yes History of Present Illness: Pt admitted with intractable back pain.  Xray shows moderate T6 comp fx.       Prior Functioning  Home Living Family/patient expects to be discharged to:: Private residence (ILF) Living Arrangements: Alone Available Help at Discharge: Available PRN/intermittently;Family;Friend(s) Type of Home: Independent living facility Home Access: Level entry Home Layout: One level Home Equipment: Walker - 4 wheels;Cane - single point Prior Function Level of Independence: Independent with assistive device(s) Comments: Pt has a 65 lb dog that lives in his apt.  He walks him daily. Communication Communication: HOH    Cognition  Cognition Arousal/Alertness: Awake/alert Behavior During Therapy: WFL for tasks assessed/performed Overall Cognitive Status: Within Functional Limits for tasks assessed    Extremity/Trunk Assessment Upper Extremity Assessment Upper Extremity Assessment: Overall WFL for tasks assessed   Balance Balance Balance Assessed: Yes Static Standing Balance Static Standing - Balance Support: Right upper extremity supported;Bilateral upper extremity supported Static Standing - Level of Assistance: 5: Stand by assistance;4: Min assist Static Standing - Comment/# of Minutes: Close guarding for safety with bil UEs supported on RW.  Requires min assist for balance  when he does not have anything to hold onto.  End of Session PT - End of Session Equipment Utilized During  Treatment: Gait belt Activity Tolerance: Patient limited by fatigue Patient left: in chair;with call bell/phone within reach Nurse Communication: Mobility status  GP     Ilda Foil 10/03/2013, 4:47 PM  Aida Raider, PT  Office # [phone] Pager [phone]

## 2013-10-03 NOTE — Evaluation (Addendum)
Occupational Therapy Evaluation Patient Details Name: Henry Meyers MRN: [id number] DOB: 04/01/1920 Today's Date: 10/03/2013 Time: 4098-1191 OT Time Calculation (min): 29 min  OT Assessment / Plan / Recommendation History of present illness Pt admitted with back pain. X ray show moderate T 6 compression fracture. Unclear chronicity.   Clinical Impression   Pt admitted with back pain.  Will benefit from continued acute OT services to address below problem list.  Pt is from independent living where he receives PRN assist per pt report.  Pt presents with generalized weakness due to being less active over past week with back pain and is generally unsteady with use of RW.  Feel that pt would benefit from short rehab stay before returning to independent living apartment. Otherwise pt will need 24/7 supervision/assist at home with HHOT.    OT Assessment  Patient needs continued OT Services    Follow Up Recommendations  SNF;Supervision/Assistance - 24 hour    Barriers to Discharge Decreased caregiver support Lives in independent living, does not have 24/7 supervision/assist per pt report.  Equipment Recommendations  None recommended by OT    Recommendations for Other Services    Frequency  Min 2X/week    Precautions / Restrictions Precautions Precautions: Fall   Pertinent Vitals/Pain C/o pain with sit<>stand transfers.    ADL  Grooming: Performed;Wash/dry hands;Min guard Where Assessed - Grooming: Unsupported standing Upper Body Bathing: Simulated;Set up Where Assessed - Upper Body Bathing: Unsupported sitting Lower Body Bathing: Simulated;Min guard Where Assessed - Lower Body Bathing: Unsupported sit to stand Upper Body Dressing: Performed;Set up Where Assessed - Upper Body Dressing: Unsupported sitting Lower Body Dressing: Performed;Minimal assistance Where Assessed - Lower Body Dressing: Unsupported sit to stand Toilet Transfer: Performed;Minimal assistance Toilet Transfer  Method:  (ambulating) Toilet Transfer Equipment: Comfort height toilet Toileting - Clothing Manipulation and Hygiene: Performed;Min guard Where Assessed - Engineer, mining and Hygiene: Standing Equipment Used: Rolling walker;Gait belt Transfers/Ambulation Related to ADLs: Min assist with RW. Pt tends to push RW too far ahead and requires assist and mod VCs for safe use of RW, esp during turns. ADL Comments: Pt donning undergarment with min assist- pt stood to pull undergarment up over hips but dropped undergarment, requiring min assist to retrieve from floor and pull up.  Pt takes daily walks at home with his dog but fatigued more easily today with walk in hallway.  Pt reports he feels weaker since he has been laying in bed for past week.      OT Diagnosis: Generalized weakness;Acute pain  OT Problem List: Decreased strength;Decreased activity tolerance;Impaired balance (sitting and/or standing);Decreased knowledge of use of DME or AE;Pain OT Treatment Interventions: Self-care/ADL training;DME and/or AE instruction;Therapeutic activities;Patient/family education;Balance training   OT Goals(Current goals can be found in the care plan section) Acute Rehab OT Goals Patient Stated Goal: to return to his apartment OT Goal Formulation: With patient Time For Goal Achievement: 10/10/13 Potential to Achieve Goals: Good ADL Goals Pt Will Perform Lower Body Dressing: with modified independence;sit to/from stand Pt Will Transfer to Toilet: with modified independence;ambulating (comfort height) Pt Will Perform Toileting - Clothing Manipulation and hygiene: with modified independence;sit to/from stand Additional ADL Goal #1: Pt will independently demonstrate safe use of RW during all functional mobility. Additional ADL Goal #2: Pt will retrieve ADL items at mod I level.  Visit Information  Last OT Received On: 10/03/13 Assistance Needed: +1 History of Present Illness: Pt admitted with  back pain. X ray show moderate T 6 compression  fracture. Unclear chronicity.       Prior Functioning     Home Living Family/patient expects to be discharged to::  (Indpendent living) Living Arrangements: Alone Available Help at Discharge: Available PRN/intermittently;Friend(s);Family Type of Home: Apartment Home Access: Level entry Home Layout: One level Home Equipment: Cane - single point;Shower seat - built in Aeronautical engineer) Prior Function Level of Independence: Independent with assistive device(s) Comments: Pt has a large dog that he cares for and walks with daily.  Pt can have meals delivered to his apartment.  Has been using rollator more recently. Communication Communication: HOH         Vision/Perception     Cognition  Cognition Arousal/Alertness: Awake/alert Behavior During Therapy: WFL for tasks assessed/performed Overall Cognitive Status: Within Functional Limits for tasks assessed    Extremity/Trunk Assessment Upper Extremity Assessment Upper Extremity Assessment: Overall WFL for tasks assessed     Mobility Bed Mobility Bed Mobility: Not assessed (pt up in chair) Transfers Transfers: Sit to Stand;Stand to Sit Sit to Stand: 4: Min guard;From chair/3-in-1;With upper extremity assist Stand to Sit: 4: Min guard;To chair/3-in-1;With armrests;With upper extremity assist     Exercise     Balance Balance Balance Assessed: Yes Static Standing Balance Static Standing - Balance Support: Right upper extremity supported;Bilateral upper extremity supported Static Standing - Level of Assistance: 5: Stand by assistance;4: Min assist Static Standing - Comment/# of Minutes: Close guarding for safety with bil UEs supported on RW.  Requires min assist for balance when he does not have anything to hold onto.   End of Session OT - End of Session Equipment Utilized During Treatment: Gait belt;Rolling walker Activity Tolerance: Patient limited by fatigue Patient left: in  chair;with call bell/phone within reach Nurse Communication: Mobility status  GO Functional Assessment Tool Used: clinical judgement Functional Limitation: Self care Self Care Current Status (Z6109): At least 20 percent but less than 40 percent impaired, limited or restricted Self Care Goal Status [phone]): 0 percent impaired, limited or restricted  10/03/2013 Cipriano Mile OTR/L Pager [phone] Office [phone]  Cipriano Mile 10/03/2013, 4:09 PM

## 2013-10-03 NOTE — Progress Notes (Signed)
   CARE MANAGEMENT NOTE 10/03/2013  Patient:  Henry Meyers, Henry Meyers   Account Number:  [id number]  Date Initiated:  10/03/2013  Documentation initiated by:  [facility]  Subjective/Objective Assessment:   adm: severe low back pain.     Action/Plan:   discharge planning   Anticipated DC Date:  10/05/2013   Anticipated DC Plan:  HOME W HOME HEALTH SERVICES      DC Planning Services  CM consult      [facility] Choice  HOME HEALTH   Choice offered to / List presented to:  C-4 Adult Children           HH agency  [facility]   Status of service:  In process, will continue to follow Medicare Important Message given?   (If response is "NO", the following Medicare IM given date fields will be blank) Date Medicare IM given:   Date Additional Medicare IM given:    Discharge Disposition:    Per UR Regulation:    If discussed at Long Length of Stay Meetings, dates discussed:    Comments:  10/03/13 11:04 CM spoke with son, Henry Meyers [phone] concerning his father, pt's, discharge.  Henry Mey[place]  If pt is discharged to a higher skilled level, pt won't be able to take dog with him.  Henry Meyers states this will be devestating to his father.  Henry Meyers states if discharge is on Monday or later, Henry Meyers will be able to arrange plenty of assistance to pt for dog, and assisting his father to dining room in wheelchair.  He expresses pt and his desire to have HH ordered for discharge so his father can stay in independent living.  Sticky note to MD explaining aforementioned with Henry Meyers's number attached.  Will continue to follow for discharge needs.  If HH is ordered pt and son have chosen [facility].  Freddy Jaksch, BSN, CM [phone].

## 2013-10-04 MED ORDER — LIDOCAINE 5 % EX PTCH: 1.0000 | MEDICATED_PATCH | Freq: Every day | CUTANEOUS | Status: AC | PRN

## 2013-10-04 MED ORDER — HYDROCODONE-ACETAMINOPHEN 5-325 MG PO TABS: 1.0000 | ORAL_TABLET | Freq: Four times a day (QID) | ORAL | Status: AC | PRN

## 2013-10-04 MED ORDER — LIDOCAINE 5 % EX PTCH: 1.0000 | MEDICATED_PATCH | CUTANEOUS | Status: DC

## 2013-10-04 NOTE — Discharge Summary (Signed)
Physician Discharge Summary  Henry Meyers ZOX:[id number] DOB: [birth year] DOA: 10/02/2013  PCP: Florentina Jenny, MD  Admit date: 10/02/2013 Discharge date: 10/04/2013  Time spent: 35 minutes  Recommendations for Outpatient Follow-up:  1. Need B-met to follow K level, and renal function.   Discharge Diagnoses:    Intractable low back pain   Right side Hydronephrosis.    Hyponatremia   Physical deconditioning   Malignant neoplasm of prostate metastatic to bone   CKD (chronic kidney disease)   Discharge Condition: Stable.   Diet recommendation: Heart Healthy.   Filed Weights   10/04/13 0300  Weight: 65.772 kg (145 lb)    History of present illness:  Henry Meyers is a 79 y.o. male with a long history of metastatic prostate cancer and chronic low back pain presented to the emergency department after seeing his physican earlier today for control of his severe low back pain. The patient reports that his pain has persistently gotten worse over the past 5 days after he had a steroid injection in the lumbar spine by his orthopedic surgeon. The patient had also been seen recently by radiation oncology for evaluation of possible palliative treatment. It was decided by radiation oncology that the multiple small areas of metastatic disease was not likely the cause of his back pain but that it was likely related to degeneration in the lumbar spine as a primary cause of his low back pain. The patient was seen in the emergency department and given pain medication and Valium with no significant improvement. The patient has been having a difficult time ambulating at home. He is not able to walk his dog which he had been doing 6 times per day prior to this recent exacerbation. The patient reports no loss of bowel or bladder control. Because the patient had been living independently he was not a candidate for 24-hour assistance at an assisted-living facility. He currently isn't too much pain to be able to  go home alone. The patient had a recent MRI study of his painful hip and lumbar spine that revealed Focal inflammation of the distal left gluteus medius tendon at the left greater trochanter with atrophy of the left gluteus medius and minimus muscles and Diffuse small metastases throughout the visualized portion of the skeleton. No pathologic fractures or stress fractures. The lumbar spine reveals severe spinal stenosis at L3-L4. A hospitalization was requested for pain management of this intractable low back pain.    Hospital Course:  1.Back pain: worse over last week. Worse with movement. He is feeling some what better. PT recommend PT/ 24 hours care. Son will provide care. . X ray show moderate T 6 compression fracture. Unclear chronicity. His pain is more lower in the lumbar spine. Pain management. Pain improved. Will provide prescription for lidocaine patch and  Vicodin.   2-Hydronephrosis: US show Marked right hydronephrosis and hydroureter. Will need to follow up with Dr Lynnae Sandhoff. Son does not want aggressive procedure.   3-Mild increase K: hold ace. Need repeat renal function.   4-Mild renal insufficiency: hold ACE. Improved with IV fluids. Need B-met to follow renal function.   5-Atrial Fibrillation, currently stable, controlled with meds. resume home amiodarone, diltiazem.    Procedures: Renal US:   Consultations:  none  Discharge Exam: Filed Vitals:   10/04/13 0528  BP: 124/74  Pulse: 70  Temp: 98.9 F (37.2 C)  Resp: 18    General: no distress.  Cardiovascular: S 1, S 2 RRR Respiratory: CTA  Discharge  Instructions  Discharge Orders   Future Orders Complete By Expires   Diet - low sodium heart healthy  As directed    Increase activity slowly  As directed        Medication List    STOP taking these medications       benazepril 10 MG tablet  Commonly known as:  LOTENSIN      TAKE these medications       amiodarone 200 MG tablet  Commonly known as:   PACERONE  Take 100 mg by mouth daily.     aspirin EC 81 MG tablet  Take 81 mg by mouth every other day.     atorvastatin 10 MG tablet  Commonly known as:  LIPITOR  Take 10 mg by mouth daily.     bicalutamide 50 MG tablet  Commonly known as:  CASODEX  Take 50 mg by mouth daily.     bimatoprost 0.03 % ophthalmic solution  Commonly known as:  LUMIGAN  Place 1 drop into both eyes at bedtime.     diltiazem 120 MG 24 hr capsule  Commonly known as:  CARDIZEM CD  Take 1 capsule (120 mg total) by mouth daily.     docusate sodium 100 MG capsule  Commonly known as:  COLACE  Take 100 mg by mouth every morning. daily     HYDROcodone-acetaminophen 5-325 MG per tablet  Commonly known as:  NORCO/VICODIN  Take 1 tablet by mouth every 6 (six) hours as needed for moderate pain or severe pain.     lidocaine 5 %  Commonly known as:  LIDODERM  Place 1 patch onto the skin daily as needed. Remove & Discard patch within 12 hours or as directed by MD     multivitamin tablet  Take 1 tablet by mouth daily.     NITROSTAT 0.4 MG SL tablet  Generic drug:  nitroGLYCERIN  DISSOLVE 1 TABLET UNDER THETONGUE EVERY 5 MINUTES AS  NEEDED     zonisamide 100 MG capsule  Commonly known as:  ZONEGRAN  Take 2 capsules (200 mg total) by mouth daily.       No Known Allergies     Follow-up Information   Follow up with DAHLSTEDT, Bertram Millard, MD. Call in 1 week.   Specialty:  Urology   Contact information:   [address] [place] [address] [phone]       Follow up with Florentina Jenny, MD In 3 days.   Specialty:  Family Medicine   Contact information:   [address] [place] [address] [phone]        The results of significant diagnostics from this hospitalization (including imaging, microbiology, ancillary and laboratory) are listed below for reference.    Significant Diagnostic Studies: Dg Thoracic Spine 2 View  10/03/2013   CLINICAL DATA:  Back  pain.  EXAM: THORACIC SPINE - 2 VIEW  COMPARISON:  None.  FINDINGS: Moderate compression fracture of T6 of unclear chronicity. Minor loss of height at T9, T10 and T11. No other evidence of a fracture. No spondylolisthesis. There are disk degenerative changes along the mid and lower thoracic spine most prominent at T12-L1. The bones are diffusely demineralized. The soft tissues are unremarkable.  IMPRESSION: Moderate T6 compression fracture of unclear chronicity. Degenerative changes and diffuse bony demineralization as detailed.   Electronically Signed   By: Amie Portland M.D.   On: 10/03/2013 10:14   Dg Lumbar Spine 2-3 Views  10/03/2013  CLINICAL DATA:  Back pain.  EXAM: LUMBAR SPINE - 2-3 VIEW  COMPARISON:  Lumbar MRI, 08/20/2013  FINDINGS: No fracture of the lumbar spine. There is generalized straightening of the normal lumbar lordosis. No spondylolisthesis. There are disk degenerative changes throughout the lumbar spine with loss of disc height, endplate osteophytes and mild endplate sclerosis.  The bones are extensively demineralized.  Dense calcification is noted along the course of a a normal caliber abdominal aorta.  IMPRESSION: No fracture or acute finding. Extensive degenerative changes unchanged from the recent prior MRI.   Electronically Signed   By: Amie Portland M.D.   On: 10/03/2013 10:15   US Renal  10/03/2013   CLINICAL DATA:  Evaluate for obstruction. Left flank pain. History of hypertension, chronic kidney disease.  EXAM: RENAL/URINARY TRACT ULTRASOUND COMPLETE  COMPARISON:  CT of the abdomen and pelvis on 08/06/2011, lumbar spine MR on 08/20/2013  FINDINGS: Right Kidney  Length: 12.4 cm in length. There is dilatation of the renal pelvis, calices, and proximal ureter. The proximal ureter measures at least 2.2 cm. There is renal parenchymal thinning. No focal mass identified.  Left Kidney  Length: 10.8 cm. Echogenicity within normal limits. No mass or hydronephrosis visualized.  Bladder   The bladder has a normal appearance. The left ureteral jet is visualized. The right ureteral jet is not seen.  Additional findings  Incidental note is made of splenic granulomata.  IMPRESSION: 1. Marked right hydronephrosis and hydroureter. 2. Renal parenchymal thinning on the right, consistent with long-standing hydronephrosis. The hydronephrosis appears new since the CT exam performed in 2012. 3. Normal appearance of the left kidney and bladder. 4. Incidental note of calcified splenic granulomata.   Electronically Signed   By: Rosalie Gums M.D.   On: 10/03/2013 12:53    Microbiology: No results found for this or any previous visit (from the past 240 hour(s)).   Labs: Basic Metabolic Panel:  Recent Labs Lab 10/02/13 1213 10/02/13 1920 10/03/13 0625  NA 129* 129* 133*  K 5.2* 4.9 5.1  CL 97 96 100  CO2  --  23 22  GLUCOSE 96 146* 95  BUN 28* 27* 28*  CREATININE 1.50* 1.35 1.37*  CALCIUM  --  8.7 8.6   Liver Function Tests: No results found for this basename: AST, ALT, ALKPHOS, BILITOT, PROT, ALBUMIN,  in the last 168 hours No results found for this basename: LIPASE, AMYLASE,  in the last 168 hours No results found for this basename: AMMONIA,  in the last 168 hours CBC:  Recent Labs Lab 10/02/13 1139 10/02/13 1213 10/02/13 1920  WBC 9.0  --  7.5  NEUTROABS 7.5  --   --   HGB 13.5 13.9 14.1  HCT 39.2 41.0 39.8  MCV 91.4  --  91.5  PLT 180  --  168   Cardiac Enzymes: No results found for this basename: CKTOTAL, CKMB, CKMBINDEX, TROPONINI,  in the last 168 hours BNP: BNP (last 3 results)  Recent Labs  05/05/13 1510  PROBNP 270.0*   CBG: No results found for this basename: GLUCAP,  in the last 168 hours     Signed:  Jearlean Demauro  [facility] 10/04/2013, 9:48 AM

## 2013-10-04 NOTE — Progress Notes (Signed)
   CARE MANAGEMENT NOTE 10/04/2013  Patient:  Henry Meyers, Henry Meyers   Account Number:  [id number]  Date Initiated:  10/03/2013  Documentation initiated by:  [facility]  Subjective/Objective Assessment:   adm: severe low back pain.     Action/Plan:   discharge planning   Anticipated DC Date:  10/05/2013   Anticipated DC Plan:  HOME W HOME HEALTH SERVICES      DC Planning Services  CM consult      [facility] Choice  HOME HEALTH   Choice offered to / List presented to:  C-4 Adult Children           HH agency  [facility]   Status of service:  In process, will continue to follow Medicare Important Message given?   (If response is "NO", the following Medicare IM given date fields will be blank) Date Medicare IM given:   Date Additional Medicare IM given:    Discharge Disposition:  HOME W HOME HEALTH SERVICES  Per UR Regulation:    If discussed at Long Length of Stay Meetings, dates discussed:    Comments:  10/04/1412:30  CM faxed [facility] ([facility]) facesheet, orders, PT/OT evals, H&P, DC summary for HHPT/OT/RN.  [facility] notified of pt discharge.  No other CM needs were communicated.  Freddy Jaksch, BSN, [place] [phone].  10/04/13 10:15  CM met with pt and pt's son, Henry Meyers, in room to discuss discharge needs.  Henry Meyers has spoken to [facility] who will provide RN, PT, OT as ordered by MD.  CM will follow with Barrett Henle 10/05/13 for any necessary clinicals as [facility], [phone] states [facility] dept will not be open until morning.  Message placed to [employer]" of pt's discharge.  Son, Henry Meyers, states RN has already scheduled a visit to pt's residence at [facility] 10/05/13. [facility] provides OT/PT and if another agency is booked, pt insurance may not cover.  Freddy Jaksch, BSN, [place] [phone].    10/03/13 11:04 CM spoke with son, Henry Meyers [phone] concerning his father, pt's, discharge.  Henry Meyers [facility]  If pt is discharged to a higher skilled level, pt won't be able to take dog with him.  Henry Meyers states this will be devestating to his father.  Henry Meyers states if discharge is on Monday or later, Henry Meyers will be able to arrange plenty of assistance to pt for dog, and assisting his father to dining room in wheelchair.  He expresses pt and his desire to have HH ordered for discharge so his father can stay in independent living.  Sticky note to MD explaining aforementioned with Mark's number attached.  Will continue to follow for discharge needs.  If HH is ordered pt and son have chosen [facility].  Freddy Jaksch, BSN, CM [phone].

## 2013-10-04 NOTE — Progress Notes (Signed)
   CARE MANAGEMENT NOTE 10/04/2013  Patient:  Henry Meyers, Henry Meyers   Account Number:  [id number]  Date Initiated:  10/03/2013  Documentation initiated by:  [facility]  Subjective/Objective Assessment:   adm: severe low back pain.     Action/Plan:   discharge planning   Anticipated DC Date:  10/05/2013   Anticipated DC Plan:  HOME W HOME HEALTH SERVICES      DC Planning Services  CM consult      [facility] Choice  HOME HEALTH   Choice offered to / List presented to:  C-4 Adult Children           HH agency  [facility]   Status of service:  In process, will continue to follow Medicare Important Message given?   (If response is "NO", the following Medicare IM given date fields will be blank) Date Medicare IM given:   Date Additional Medicare IM given:    Discharge Disposition:  HOME W HOME HEALTH SERVICES  Per UR Regulation:    If discussed at Long Length of Stay Meetings, dates discussed:    Comments:  10/04/13 10:15  CM met with pt and pt's son, Loraine Leriche, in room to discuss discharge needs.  Loraine Leriche has spoken to [facility] who will provide RN, PT, OT as ordered by MD.  CM will follow with Barrett Henle 10/05/13 for any necessary clinicals as [facility], [phone] states [facility] will not be open until morning.  Message placed to [employer]" of pt's discharge.  Son, Loraine Leriche, states RN has already scheduled a visit to pt's residence at [facility] 10/05/13. [facility] provides OT/PT and if another agency is booked, pt insurance may not cover.  Freddy Jaksch, BSN, [place] [phone].    10/03/13 11:04 CM spoke with son, Loraine Leriche [phone] concerning his father, pt's, discharge.  Loraine Leriche states [facility]  If pt is discharged to a higher skilled level, pt won't be able to take dog with him.  Loraine Leriche states this will be devestating to his father.  Loraine Leriche states if discharge is on Monday or later, Loraine Leriche will be able to arrange plenty of  assistance to pt for dog, and assisting his father to dining room in wheelchair.  He expresses pt and his desire to have HH ordered for discharge so his father can stay in independent living.  Sticky note to MD explaining aforementioned with Mark's number attached.  Will continue to follow for discharge needs.  If HH is ordered pt and son have chosen [facility].  Freddy Jaksch, BSN, CM [phone].

## 2013-10-06 ENCOUNTER — Inpatient Hospital Stay (HOSPITAL_COMMUNITY)
Admission: EM | Admit: 2013-10-06 | Discharge: 2013-10-19 | DRG: 291 | Disposition: E | Payer: Medicare Other | Attending: Internal Medicine | Admitting: Internal Medicine

## 2013-10-06 ENCOUNTER — Encounter (HOSPITAL_COMMUNITY): Payer: Self-pay | Admitting: Emergency Medicine

## 2013-10-06 ENCOUNTER — Emergency Department (HOSPITAL_COMMUNITY): Payer: Medicare Other

## 2013-10-06 DIAGNOSIS — R0602 Shortness of breath: Secondary | ICD-10-CM

## 2013-10-06 DIAGNOSIS — R233 Spontaneous ecchymoses: Secondary | ICD-10-CM | POA: Diagnosis present

## 2013-10-06 DIAGNOSIS — N189 Chronic kidney disease, unspecified: Secondary | ICD-10-CM

## 2013-10-06 DIAGNOSIS — N1330 Unspecified hydronephrosis: Secondary | ICD-10-CM | POA: Diagnosis present

## 2013-10-06 DIAGNOSIS — C419 Malignant neoplasm of bone and articular cartilage, unspecified: Secondary | ICD-10-CM

## 2013-10-06 DIAGNOSIS — R5381 Other malaise: Secondary | ICD-10-CM | POA: Diagnosis present

## 2013-10-06 DIAGNOSIS — G8929 Other chronic pain: Secondary | ICD-10-CM | POA: Diagnosis present

## 2013-10-06 DIAGNOSIS — I959 Hypotension, unspecified: Secondary | ICD-10-CM | POA: Diagnosis present

## 2013-10-06 DIAGNOSIS — R238 Other skin changes: Secondary | ICD-10-CM | POA: Diagnosis present

## 2013-10-06 DIAGNOSIS — H9193 Unspecified hearing loss, bilateral: Secondary | ICD-10-CM | POA: Diagnosis present

## 2013-10-06 DIAGNOSIS — Z79899 Other long term (current) drug therapy: Secondary | ICD-10-CM

## 2013-10-06 DIAGNOSIS — N183 Chronic kidney disease, stage 3 unspecified: Secondary | ICD-10-CM | POA: Diagnosis present

## 2013-10-06 DIAGNOSIS — Z8673 Personal history of transient ischemic attack (TIA), and cerebral infarction without residual deficits: Secondary | ICD-10-CM

## 2013-10-06 DIAGNOSIS — E785 Hyperlipidemia, unspecified: Secondary | ICD-10-CM

## 2013-10-06 DIAGNOSIS — I4891 Unspecified atrial fibrillation: Secondary | ICD-10-CM

## 2013-10-06 DIAGNOSIS — I4892 Unspecified atrial flutter: Secondary | ICD-10-CM

## 2013-10-06 DIAGNOSIS — I10 Essential (primary) hypertension: Secondary | ICD-10-CM

## 2013-10-06 DIAGNOSIS — K922 Gastrointestinal hemorrhage, unspecified: Secondary | ICD-10-CM | POA: Diagnosis present

## 2013-10-06 DIAGNOSIS — I509 Heart failure, unspecified: Secondary | ICD-10-CM

## 2013-10-06 DIAGNOSIS — Z923 Personal history of irradiation: Secondary | ICD-10-CM

## 2013-10-06 DIAGNOSIS — I5032 Chronic diastolic (congestive) heart failure: Secondary | ICD-10-CM | POA: Diagnosis present

## 2013-10-06 DIAGNOSIS — J690 Pneumonitis due to inhalation of food and vomit: Secondary | ICD-10-CM | POA: Diagnosis present

## 2013-10-06 DIAGNOSIS — Z7982 Long term (current) use of aspirin: Secondary | ICD-10-CM

## 2013-10-06 DIAGNOSIS — I129 Hypertensive chronic kidney disease with stage 1 through stage 4 chronic kidney disease, or unspecified chronic kidney disease: Secondary | ICD-10-CM | POA: Diagnosis present

## 2013-10-06 DIAGNOSIS — E875 Hyperkalemia: Secondary | ICD-10-CM | POA: Diagnosis present

## 2013-10-06 DIAGNOSIS — C61 Malignant neoplasm of prostate: Secondary | ICD-10-CM

## 2013-10-06 DIAGNOSIS — R569 Unspecified convulsions: Secondary | ICD-10-CM | POA: Diagnosis present

## 2013-10-06 DIAGNOSIS — I5033 Acute on chronic diastolic (congestive) heart failure: Principal | ICD-10-CM

## 2013-10-06 DIAGNOSIS — N179 Acute kidney failure, unspecified: Secondary | ICD-10-CM | POA: Diagnosis present

## 2013-10-06 DIAGNOSIS — M545 Low back pain, unspecified: Secondary | ICD-10-CM | POA: Diagnosis present

## 2013-10-06 DIAGNOSIS — H9190 Unspecified hearing loss, unspecified ear: Secondary | ICD-10-CM | POA: Diagnosis present

## 2013-10-06 DIAGNOSIS — C7951 Secondary malignant neoplasm of bone: Secondary | ICD-10-CM | POA: Diagnosis present

## 2013-10-06 DIAGNOSIS — G893 Neoplasm related pain (acute) (chronic): Secondary | ICD-10-CM | POA: Diagnosis present

## 2013-10-06 DIAGNOSIS — M5459 Other low back pain: Secondary | ICD-10-CM | POA: Diagnosis present

## 2013-10-06 HISTORY — DX: Chronic kidney disease, stage 3 (moderate): N18.3

## 2013-10-06 HISTORY — DX: Personal history of other diseases of the digestive system: Z87.19

## 2013-10-06 HISTORY — DX: Shortness of breath: R06.02

## 2013-10-06 HISTORY — DX: Chronic kidney disease, stage 3 unspecified: N18.30

## 2013-10-06 HISTORY — DX: Secondary malignant neoplasm of bone: C79.51

## 2013-10-06 HISTORY — DX: Personal history of transient ischemic attack (TIA), and cerebral infarction without residual deficits: Z86.73

## 2013-10-06 HISTORY — DX: Thrombocytopenia, unspecified: D69.6

## 2013-10-06 LAB — CBC WITH DIFFERENTIAL/PLATELET
Basophils Relative: 0 % (ref 0–1)
Eosinophils Absolute: 0 10*3/uL (ref 0.0–0.7)
Eosinophils Relative: 0 % (ref 0–5)
HCT: 40.1 % (ref 39.0–52.0)
Hemoglobin: 14 g/dL (ref 13.0–17.0)
Lymphocytes Relative: 5 % — ABNORMAL LOW (ref 12–46)
MCH: 33 pg (ref 26.0–34.0)
MCHC: 34.9 g/dL (ref 30.0–36.0)
MCV: 94.6 fL (ref 78.0–100.0)
Monocytes Absolute: 1.1 10*3/uL — ABNORMAL HIGH (ref 0.1–1.0)
Monocytes Relative: 7 % (ref 3–12)
Neutro Abs: 13.5 10*3/uL — ABNORMAL HIGH (ref 1.7–7.7)
Platelets: 204 10*3/uL (ref 150–400)
RDW: 13.9 % (ref 11.5–15.5)
WBC: 15.4 10*3/uL — ABNORMAL HIGH (ref 4.0–10.5)

## 2013-10-06 LAB — POCT I-STAT TROPONIN I: Troponin i, poc: 0.16 ng/mL (ref 0.00–0.08)

## 2013-10-06 LAB — COMPREHENSIVE METABOLIC PANEL
ALT: 20 U/L (ref 0–53)
AST: 36 U/L (ref 0–37)
Albumin: 3.6 g/dL (ref 3.5–5.2)
BUN: 37 mg/dL — ABNORMAL HIGH (ref 6–23)
Calcium: 9.3 mg/dL (ref 8.4–10.5)
Chloride: 97 mEq/L (ref 96–112)
Creatinine, Ser: 1.54 mg/dL — ABNORMAL HIGH (ref 0.50–1.35)
Sodium: 132 mEq/L — ABNORMAL LOW (ref 135–145)
Total Bilirubin: 0.5 mg/dL (ref 0.3–1.2)

## 2013-10-06 LAB — TROPONIN I
Troponin I: <0.3 ng/mL (ref <0.30)
Troponin I: <0.3 ng/mL (ref <0.30)

## 2013-10-06 LAB — POCT I-STAT 3, ART BLOOD GAS (G3+)
Acid-base deficit: 5 mmol/L — ABNORMAL HIGH (ref 0.0–2.0)
Bicarbonate: 20.1 mEq/L (ref 20.0–24.0)
O2 Saturation: 91 %
pH, Arterial: 7.343 — ABNORMAL LOW (ref 7.350–7.450)
pO2, Arterial: 63 mmHg — ABNORMAL LOW (ref 80.0–100.0)

## 2013-10-06 LAB — D-DIMER, QUANTITATIVE: D-Dimer, Quant: 2.41 ug/mL-FEU — ABNORMAL HIGH (ref 0.00–0.48)

## 2013-10-06 MED ORDER — ZONISAMIDE 100 MG PO CAPS
200.0000 mg | ORAL_CAPSULE | Freq: Every day | ORAL | Status: DC
  Filled 2013-10-06 (×2): qty 2

## 2013-10-06 MED ORDER — HYDROMORPHONE HCL PF 1 MG/ML IJ SOLN: 1.0000 mg | INTRAMUSCULAR | Status: DC | PRN

## 2013-10-06 MED ORDER — SODIUM CHLORIDE 0.9 % IJ SOLN
3.0000 mL | Freq: Two times a day (BID) | INTRAMUSCULAR | Status: DC
  Administered 2013-10-07 (×2): 3 mL via INTRAVENOUS

## 2013-10-06 MED ORDER — ONDANSETRON HCL 4 MG/2ML IJ SOLN
4.0000 mg | Freq: Four times a day (QID) | INTRAMUSCULAR | Status: DC | PRN
  Administered 2013-10-07: 07:00:00 4 mg via INTRAVENOUS
  Filled 2013-10-06: qty 2

## 2013-10-06 MED ORDER — DOCUSATE SODIUM 100 MG PO CAPS
100.0000 mg | ORAL_CAPSULE | Freq: Every morning | ORAL | Status: DC
  Filled 2013-10-06: qty 1

## 2013-10-06 MED ORDER — LATANOPROST 0.005 % OP SOLN
1.0000 [drp] | Freq: Every day | OPHTHALMIC | Status: DC
  Administered 2013-10-07: 1 [drp] via OPHTHALMIC
  Filled 2013-10-06: qty 2.5

## 2013-10-06 MED ORDER — HYDROCODONE-ACETAMINOPHEN 5-325 MG PO TABS: 1.0000 | ORAL_TABLET | Freq: Four times a day (QID) | ORAL | Status: DC | PRN

## 2013-10-06 MED ORDER — FUROSEMIDE 10 MG/ML IJ SOLN
40.0000 mg | Freq: Once | INTRAMUSCULAR | Status: AC
  Administered 2013-10-06: 40 mg via INTRAVENOUS
  Filled 2013-10-06: qty 4

## 2013-10-06 MED ORDER — ASPIRIN EC 81 MG PO TBEC: 81.0000 mg | DELAYED_RELEASE_TABLET | ORAL | Status: DC

## 2013-10-06 MED ORDER — ADULT MULTIVITAMIN W/MINERALS CH
1.0000 | ORAL_TABLET | Freq: Every day | ORAL | Status: DC
  Administered 2013-10-07: 1 via ORAL
  Filled 2013-10-06 (×2): qty 1

## 2013-10-06 MED ORDER — FUROSEMIDE 10 MG/ML IJ SOLN: 40.0000 mg | Freq: Two times a day (BID) | INTRAMUSCULAR | Status: DC

## 2013-10-06 MED ORDER — BICALUTAMIDE 50 MG PO TABS
50.0000 mg | ORAL_TABLET | Freq: Every day | ORAL | Status: DC
Start: 2013-10-07 — End: 2013-10-07
  Administered 2013-10-07: 50 mg via ORAL
  Filled 2013-10-06 (×2): qty 1

## 2013-10-06 MED ORDER — ATORVASTATIN CALCIUM 10 MG PO TABS
10.0000 mg | ORAL_TABLET | Freq: Every day | ORAL | Status: DC
  Filled 2013-10-06: qty 1

## 2013-10-06 MED ORDER — ONDANSETRON HCL 4 MG/2ML IJ SOLN
4.0000 mg | Freq: Once | INTRAMUSCULAR | Status: AC
  Administered 2013-10-06: 4 mg via INTRAVENOUS

## 2013-10-06 MED ORDER — ONDANSETRON HCL 4 MG PO TABS: 4.0000 mg | ORAL_TABLET | Freq: Four times a day (QID) | ORAL | Status: DC | PRN

## 2013-10-06 MED ORDER — FUROSEMIDE 10 MG/ML IJ SOLN
40.0000 mg | Freq: Two times a day (BID) | INTRAMUSCULAR | Status: DC
  Filled 2013-10-06 (×2): qty 4

## 2013-10-06 MED ORDER — LIDOCAINE 5 % EX PTCH
1.0000 | MEDICATED_PATCH | Freq: Every day | CUTANEOUS | Status: DC | PRN
  Filled 2013-10-06: qty 1

## 2013-10-06 MED ORDER — HEPARIN BOLUS VIA INFUSION
3000.0000 [IU] | Freq: Once | INTRAVENOUS | Status: AC
  Administered 2013-10-06: 3000 [IU] via INTRAVENOUS
  Filled 2013-10-06 (×2): qty 3000

## 2013-10-06 MED ORDER — DILTIAZEM HCL ER COATED BEADS 120 MG PO CP24
120.0000 mg | ORAL_CAPSULE | Freq: Every day | ORAL | Status: DC
  Filled 2013-10-06: qty 1

## 2013-10-06 MED ORDER — HEPARIN (PORCINE) IN NACL 100-0.45 UNIT/ML-% IJ SOLN
750.0000 [IU]/h | INTRAMUSCULAR | Status: DC
  Administered 2013-10-06: 900 [IU]/h via INTRAVENOUS
  Administered 2013-10-07: 08:00:00 750 [IU]/h via INTRAVENOUS
  Filled 2013-10-06 (×3): qty 250

## 2013-10-06 MED ORDER — ONDANSETRON HCL 4 MG/2ML IJ SOLN
INTRAMUSCULAR | Status: AC
  Administered 2013-10-06: 4 mg via INTRAVENOUS
  Filled 2013-10-06: qty 2

## 2013-10-06 MED ORDER — AMIODARONE HCL 100 MG PO TABS
100.0000 mg | ORAL_TABLET | Freq: Every day | ORAL | Status: DC
  Administered 2013-10-07: 100 mg via ORAL
  Filled 2013-10-06 (×2): qty 1

## 2013-10-06 NOTE — ED Notes (Signed)
EKG delayed due to limited room availability. 

## 2013-10-06 NOTE — Progress Notes (Signed)
ANTICOAGULATION CONSULT NOTE - Initial Consult  Pharmacy Consult for heparin Indication: chest pain/ACS  No Known Allergies  Patient Measurements:   Heparin Dosing Weight: 66 kg  Vital Signs: Temp: 95.5 F (35.3 C) (11/18 1722) Temp src: Rectal (11/18 1722) BP: 113/55 mmHg (11/18 1855) Pulse Rate: 61 (11/18 1855)  Labs:  Recent Labs  09/21/2013 1815  HGB 14.0  HCT 40.1  PLT 204    The CrCl is unknown because both a height and weight (above a minimum accepted value) are required for this calculation.   Medical History: Past Medical History  Diagnosis Date  . HTN (hypertension)   . HLD (hyperlipidemia)   . Ventricular tachycardia, non-sustained 2004  . Diastolic CHF, chronic   . Atrial fibrillation   . Elevated troponin   . Heart murmur   . Seizures   . CHF (congestive heart failure)   . CVA (cerebral infarction)   . Bone cancer 08/27/13 MR    pelvis/and hips and lumbar spine  . Prostate cancer 1993  . Glaucoma   . CKD (chronic kidney disease) 10/02/2013    Medications:  See home med list   Assessment: 79 year old man to be admitted for ACS to start in IV heparin infusion. Goal of Therapy:  Heparin level 0.3-0.7 units/ml Monitor platelets by anticoagulation protocol: Yes   Plan:  Give 3000 units bolus x 1 Start heparin infusion at 900 units/hr Check anti-Xa level in 8 hours and daily while on heparin Continue to monitor H&H and platelets  Mickeal Skinner 10/09/2013,7:04 PM

## 2013-10-06 NOTE — ED Notes (Signed)
Pt is here with onset of chest pain and sob since 1200 today and was found to be in aflutter in the 40s with no history.  Bp:  98/60.  Pt HOH.  Pt got 4basa

## 2013-10-06 NOTE — ED Notes (Signed)
Pt's son Joy Haegele called from [place], pt gave this RN a verbal consent to update his son. [place] [phone]

## 2013-10-06 NOTE — Consult Note (Addendum)
Primary cardiologist: Henry Meyers Consulting cardiologist: Henry Meyers  Clinical Summary Henry Meyers is a 79 y.o.male recently discharged from the Hospitalist service on 11/16 with intractable low back pain in the setting of metastatic prostate cancer. He was managed medically for pain control. Additional medical problems are noted below. He is followed by Henry Meyers in our practice with a history of PAF/flutter and also diastolic heart failure. He has not been anticoagulated with previous history of GI bleeding on per DACs, was noted be in sinus rhythm by ECG in June of this year. I also see that he stopped aspirin secondary to excessive bruising.  He now presents back to the ER complaining of the onset of shortness of breath that occurred after lunch today, no definite chest pain or palpitations. Still having problems with back pain, uses a walker to ambulate to a limited degree. He has also had increasing leg edema. Patient has been in the independent living section of [facility].  ECG at this time shows atrial flutter with 4:1 block, IVCD with poor anterior R wave progression, rule out old anterior infarct pattern. Nonspecific ST changes overall. POC troponin I level is minimally increased at 0.16 (normal lab troponin I). Pro-BNP elevated at 2227, also with d-dimer of 2.41.  Patient states he feels weak, somewhat distended in his abdomen with need to pass flatus. Short of breath but no chest pain.   No Known Allergies  Medications Amiodarone 100 mg by mouth daily Aspirin 81 mg by mouth daily Lipitor 10 mg by mouth daily AcipHex 50 mg by mouth daily Lumigan eyedrops each bedtime Diltiazem CD 120 mg by mouth daily Colace 100 mg by mouth daily Vicodin when necessary Lidoderm patch MVI 1 by mouth daily Nitroglycerin 0.4 mg when necessary Zonegran 2 tablets (200 mg) by mouth daily   Past Medical History  Diagnosis Date  . HTN (hypertension)   . HLD  (hyperlipidemia)   . Ventricular tachycardia, non-sustained 2004  . Diastolic CHF, chronic     LVEF 65% 2008  . Atrial fibrillation   . Elevated troponin     In setting of atrial fibrillation with RVR: Lexiscan myoview in 3/11 showed EF 76%, no ischemia or infarction  . Seizures   . History of stroke   . Bone metastasis     Pelvis, hips and lumbar spine  . Prostate cancer 1993    Status post radioactive seeds and now on Lupron therapy  . Glaucoma   . CKD (chronic kidney disease) stage 3, GFR 30-59 ml/min   . History of GI bleed     On Pradaxa - Stopped July 2014  . Thrombocytopenia     Past Surgical History  Procedure Laterality Date  . Wrist surgery      left/screws  . Cataract extraction      bilat.eyes    Family History  Problem Relation Age of Onset  . Parkinsonism Father     Social History Henry Meyers reports that he has never smoked. He has never used smokeless tobacco. Henry Meyers reports that he drinks alcohol.  Review of Systems Limited ambulation recently due to back pain, uses a walker,. Also increased fall risk. No chest pain symptoms. Stable appetite. Chronic leg edema, worse in the last several days. No cough, fevers or chills.  Physical Examination Blood pressure 113/55, pulse 61, temperature 95.5 F (35.3 C), temperature source Rectal, resp. rate 36, SpO2 94.00%. No intake or output data in the 24 hours  ending 09/25/2013 1946  Frail elderly male, no acute distress but short of breath. HEENT: Conjunctiva and lids normal, oropharynx clear. Neck: Supple, elevated JVP noted, no carotid bruits, no thyromegaly. Lungs: Mildly short of breath at rest, diminished breath sounds at the bases, no wheezing. Cardiac: Largely regular rhythm, distant, indistinct PMI, no S3 or significant systolic murmur, no pericardial rub. Abdomen: Soft, nontender, bowel sounds present, no guarding or rebound. Extremities: 2+ edema, distal pulses 1-2+. Skin: Warm and  dry. Musculoskeletal: Kyphosis. Neuropsychiatric: Alert and oriented x3, affect grossly appropriate. Hard of hearing.   Lab Results  Basic Metabolic Panel:  Recent Labs Lab 10/02/13 1213 10/02/13 1920 10/03/13 0625 09/23/2013 1815  NA 129* 129* 133* 132*  K 5.2* 4.9 5.1 5.2*  CL 97 96 100 97  CO2  --  23 22 21   GLUCOSE 96 146* 95 170*  BUN 28* 27* 28* 37*  CREATININE 1.50* 1.35 1.37* 1.54*  CALCIUM  --  8.7 8.6 9.3    Liver Function Tests:  Recent Labs Lab 10/11/2013 1815  AST 36  ALT 20  ALKPHOS 101  BILITOT 0.5  PROT 6.3  ALBUMIN 3.6    CBC:  Recent Labs Lab 10/02/13 1139 10/02/13 1213 10/02/13 1920 10/16/2013 1815  WBC 9.0  --  7.5 15.4*  NEUTROABS 7.5  --   --  13.5*  HGB 13.5 13.9 14.1 14.0  HCT 39.2 41.0 39.8 40.1  MCV 91.4  --  91.5 94.6  PLT 180  --  168 204    Cardiac Enzymes:  Recent Labs Lab 10/12/2013 1856  TROPONINI <0.30    Imaging CHEST 2 VIEW  COMPARISON: T-spine radiograph 10/03/2013.  FINDINGS:  Low lung volumes. Stable cardiomegaly. Small left pleural effusion  with underlying opacities. Trace right pleural effusion with right  basilar atelectasis. Multiple gaseous distended loops of large and  small bowel loops, incompletely evaluated. Stable T6 compression  deformity with additional mild height loss of the T9, T10 and T11  vertebral bodies.  IMPRESSION:  1. Low lung volumes. Small left pleural effusion with underlying  opacities which may represent atelectasis. Infection not excluded.  2. Multiple gaseous distended loops of small and large bowel. Ileus  or obstruction not excluded. Recommend correlation with abdominal  radiography.   Impression  1. Atrial flutter with controlled ventricular response, duration uncertain, but possibly recent with onset of shortness of breath. He has previous history of PAF/flutter. CHADSVASC score 5-6, but not anticoagulated with prior history of GI bleeding when on Pradaxa. Nondiagnostic  increase in POC troponin I, not confirmed by lab troponin I. He is not reporting any chest pain.  2. Progressive leg edema, history of diastolic heart failure, question acute exacerbation. Has evidence of volume overload on examination. Last echocardiogram was in 2008.  3. Increased d-dimer, ER staff ordering ventilation perfusion lung scan to exclude pulmonary embolus. Patient has been placed on heparin by ER staff.  4. CKD, stage 3.  5. Metastatic prostate cancer to bone.  6. Back pain secondary to problem #5.  7. Hypertension, blood pressure currently stable.   Recommendations  Discussed with ER staff. Patient is being readmitted to the Hospitalist service, just recently discharged from same. We will follow in consultation from a cardiac perspective. VQ scan pending per ER staff. Patient is on heparin for now. We will arrange an echocardiogram to reassess cardiac structure and function as this may help to guide further treatment options. Initiate diuretic. For now continue current cardiac regimen which includes amiodarone.  Cycle cardiac markers. Followup ECG in a.m.   Jonelle Meyers, M.D., F.A.C.C.

## 2013-10-06 NOTE — ED Notes (Signed)
Pt c/o non radiating central chest discomfort ans SOB starting this afternoon. Pt denies abd/back pain, diaphoresis, nausea, diarrhea, or dizziness. Pt reports he has vomited x1 today, pt vomited x1 while in the ED. Pt states after eating lunch today he began feeling weak, SOB that increased with any activity, and generalized chest discomfort.

## 2013-10-06 NOTE — ED Notes (Signed)
Lab at bedside for 2nd troponin.

## 2013-10-06 NOTE — H&P (Signed)
[facility] History and Physical  Henry Meyers:[id number] DOB: Oct 08, 1920    PCP:   Florentina Jenny, MD   Chief Complaint: acute shortness of breath.  HPI: Henry Meyers is an 79 y.o. male with a long history of metastatic prostate cancer and chronic low back pain, history of PAF, not on anticoagulation because of GI bleed previously while on Pradaxa, Hx of VTach, diastolic CHF, prior CVA, hx of seizure, recently admitted and discharged a few days ago for intractable back pain, returned today for acute onset of shortness of breath without chest pain.   He denied fever, chills, or coughs.  Evaluation in the ER included a negative troponin (though Istat troponin was 0.16), BNP of 2227, Cr of 1.5, K of 5.2, WBC of 15K, normal Hb.  His EKG showed afib with controlled rate, and CXR showed small bilateral effusion, can't excluded infiltrates, and dilated bowel loops.  Cardiology was consulted, recommended continuation of his cardiac meds and to obtain cardiac ECHO.  He was started on IV Heparin pending V/Q scan.  Hospitalist was asked to admit him for further evaluation and Tx.    Rewiew of Systems:  Constitutional: Negative for malaise, fever and chills. No significant weight loss or weight gain Eyes: Negative for eye pain, redness and discharge, diplopia, visual changes, or flashes of light. ENMT: Negative for ear pain, hoarseness, nasal congestion, sinus pressure and sore throat. No headaches; tinnitus, drooling, or problem swallowing. Cardiovascular: Negative for chest pain, palpitations, diaphoresis. PND Respiratory: Negative for cough, hemoptysis, wheezing and stridor. No pleuritic chestpain. Gastrointestinal: Negative for nausea, vomiting, diarrhea, constipation, abdominal pain, melena, blood in stool, hematemesis, jaundice and rectal bleeding.    Genitourinary: Negative for frequency, dysuria, incontinence,flank pain and hematuria; Musculoskeletal: Negative for back pain and neck  pain. Negative for swelling and trauma.;  Skin: . Negative for pruritus, rash, abrasions, bruising and skin lesion.; ulcerations Neuro: Negative for headache, lightheadedness and neck stiffness. Negative for weakness, altered level of consciousness , altered mental status, extremity weakness, burning feet, involuntary movement, seizure and syncope.  Psych: negative for anxiety, depression, insomnia, tearfulness, panic attacks, hallucinations, paranoia, suicidal or homicidal ideation    Past Medical History  Diagnosis Date  . HTN (hypertension)   . HLD (hyperlipidemia)   . Ventricular tachycardia, non-sustained 2004  . Diastolic CHF, chronic     LVEF 65% 2008  . Atrial fibrillation   . Elevated troponin     In setting of atrial fibrillation with RVR: Lexiscan myoview in 3/11 showed EF 76%, no ischemia or infarction  . Seizures   . History of stroke   . Bone metastasis     Pelvis, hips and lumbar spine  . Prostate cancer 1993    Status post radioactive seeds and now on Lupron therapy  . Glaucoma   . CKD (chronic kidney disease) stage 3, GFR 30-59 ml/min   . History of GI bleed     On Pradaxa - Stopped July 2014  . Thrombocytopenia   . Shortness of breath 09/29/2013    Past Surgical History  Procedure Laterality Date  . Wrist surgery      left/screws  . Cataract extraction      bilat.eyes    Medications:  HOME MEDS: Prior to Admission medications   Medication Sig Start Date End Date Taking? Authorizing Provider  amiodarone (PACERONE) 200 MG tablet Take 100 mg by mouth daily.   Yes Historical Provider, MD  aspirin EC 81 MG tablet Take 81 mg by  mouth every other day.  05/05/13  Yes Laurey Morale, MD  atorvastatin (LIPITOR) 10 MG tablet Take 10 mg by mouth daily.   Yes Historical Provider, MD  bicalutamide (CASODEX) 50 MG tablet Take 50 mg by mouth daily.   Yes Historical Provider, MD  diltiazem (CARDIZEM CD) 120 MG 24 hr capsule Take 1 capsule (120 mg total) by mouth daily.  08/24/13  Yes Laurey Morale, MD  docusate sodium (COLACE) 100 MG capsule Take 100 mg by mouth every morning.    Yes Historical Provider, MD  HYDROcodone-acetaminophen (NORCO/VICODIN) 5-325 MG per tablet Take 1 tablet by mouth every 6 (six) hours as needed for moderate pain or severe pain. 10/04/13  Yes Belkys A Regalado, MD  latanoprost (XALATAN) 0.005 % ophthalmic solution Place 1 drop into both eyes at bedtime.   Yes Historical Provider, MD  lidocaine (LIDODERM) 5 % Place 1 patch onto the skin daily as needed. Remove & Discard patch within 12 hours or as directed by MD 10/04/13  Yes Belkys A Regalado, MD  Multiple Vitamin (MULTIVITAMIN) tablet Take 1 tablet by mouth daily.     Yes Historical Provider, MD  NITROSTAT 0.4 MG SL tablet DISSOLVE 1 TABLET UNDER THETONGUE EVERY 5 MINUTES AS  NEEDED 07/03/13  Yes Laurey Morale, MD  zonisamide (ZONEGRAN) 100 MG capsule Take 2 capsules (200 mg total) by mouth daily. 07/28/12  Yes Milas Gain, MD     Allergies:  No Known Allergies  Social History:   reports that he has never smoked. He has never used smokeless tobacco. He reports that he drinks alcohol. He reports that he does not use illicit drugs.  Family History: Family History  Problem Relation Age of Onset  . Parkinsonism Father      Physical Exam: Filed Vitals:   Nov 01, 2013 1715 Nov 01, 2013 1722 11-01-13 1855 2013/11/01 2010  BP: 112/41 112/41 113/55 124/74  Pulse: 39 67 61 78  Temp:  95.5 F (35.3 C)    TempSrc:  Rectal    Resp: 29 34 36 33  SpO2: 100% 90% 94% 99%   Blood pressure 124/74, pulse 78, temperature 95.5 F (35.3 C), temperature source Rectal, resp. rate 33, SpO2 99.00%.  GEN:  Pleasant  patient lying in the stretcher in no acute distress; cooperative with exam. PSYCH:  alert and oriented x4; does not appear anxious or depressed; affect is appropriate. HEENT: Mucous membranes pink and anicteric; PERRLA; EOM intact; no cervical lymphadenopathy nor thyromegaly or carotid  bruit; no JVD; There were no stridor. Neck is very supple. Breasts:: Not examined CHEST WALL: No tenderness CHEST: Normal respiration, decreased BS, no wheezing, bilateral basilar rales. HEART: Regular rate and rhythm.  There are no murmur, rub, or gallops.   BACK: No kyphosis or scoliosis; no CVA tenderness ABDOMEN: soft and non-tender; no masses, no organomegaly, normal abdominal bowel sounds; no pannus; no intertriginous candida. There is no rebound and no distention. Rectal Exam: Not done EXTREMITIES: No bone or joint deformity; age-appropriate arthropathy of the hands and knees; no edema; no ulcerations.  There is no calf tenderness. Genitalia: not examined PULSES: 2+ and symmetric SKIN: Normal hydration no rash or ulceration CNS: Cranial nerves 2-12 grossly intact no focal lateralizing neurologic deficit.  Speech is fluent; uvula elevated with phonation, facial symmetry and tongue midline. DTR are normal bilaterally, cerebella exam is intact, barbinski is negative and strengths are equaled bilaterally.  No sensory loss.   Labs on Admission:  Basic Metabolic Panel:  Recent Labs  Lab 10/02/13 1213 10/02/13 1920 10/03/13 0625 17-Oct-2013 1815  NA 129* 129* 133* 132*  K 5.2* 4.9 5.1 5.2*  CL 97 96 100 97  CO2  --  23 22 21   GLUCOSE 96 146* 95 170*  BUN 28* 27* 28* 37*  CREATININE 1.50* 1.35 1.37* 1.54*  CALCIUM  --  8.7 8.6 9.3   Liver Function Tests:  Recent Labs Lab 10/17/13 1815  AST 36  ALT 20  ALKPHOS 101  BILITOT 0.5  PROT 6.3  ALBUMIN 3.6   No results found for this basename: LIPASE, AMYLASE,  in the last 168 hours No results found for this basename: AMMONIA,  in the last 168 hours CBC:  Recent Labs Lab 10/02/13 1139 10/02/13 1213 10/02/13 1920 2013-10-17 1815  WBC 9.0  --  7.5 15.4*  NEUTROABS 7.5  --   --  13.5*  HGB 13.5 13.9 14.1 14.0  HCT 39.2 41.0 39.8 40.1  MCV 91.4  --  91.5 94.6  PLT 180  --  168 204   Cardiac Enzymes:  Recent Labs Lab  10/17/2013 1856  TROPONINI <0.30    CBG: No results found for this basename: GLUCAP,  in the last 168 hours   Radiological Exams on Admission: Dg Chest 2 View  10/17/2013   CLINICAL DATA:  Chest pain.  EXAM: CHEST  2 VIEW  COMPARISON:  T-spine radiograph 10/03/2013.  FINDINGS: Low lung volumes. Stable cardiomegaly. Small left pleural effusion with underlying opacities. Trace right pleural effusion with right basilar atelectasis. Multiple gaseous distended loops of large and small bowel loops, incompletely evaluated. Stable T6 compression deformity with additional mild height loss of the T9, T10 and T11 vertebral bodies.  IMPRESSION: 1. Low lung volumes. Small left pleural effusion with underlying opacities which may represent atelectasis. Infection not excluded. 2. Multiple gaseous distended loops of small and large bowel. Ileus or obstruction not excluded. Recommend correlation with abdominal radiography.   Electronically Signed   By: Annia Belt M.D.   On: 10/17/13 17:58    EKG: Independently reviewed. afib with rate control and no acute ST-T changes.   Assessment/Plan Present on Admission:  . DIASTOLIC HEART FAILURE, CHRONIC . CKD (chronic kidney disease) . Atrial fibrillation . Easy bruising . HYPERTENSION . Hearing loss of both ears . Intractable low back pain . Physical deconditioning . Focal seizures  PLAN:  I think his shortness of breath is from volume overload in the setting of diastolic congestive heart failure.  It may have been from IVF given last hospitalization.  Will continue IV Heparin until V/Q r/out.  The troponin from the lab is negative, but will cycle more.  ECHO was ordered as per recommendation of cardiology.  Will diurese him and continue his home meds.  He is otherwise stable, and will give pain meds for his pain from bone mets.  His record indicates he is a full code.  I will admit him to telemetry.  Thank you for allowing me to participate in his care.     Other plans as per orders.  Code Status: FULL Unk Lightning, MD. [facility] Pager [phone] 7pm to 7am.  10/17/13, 8:43 PM

## 2013-10-06 NOTE — ED Notes (Signed)
Bjorn Loser PA with Cardiology at bedside.

## 2013-10-06 NOTE — ED Provider Notes (Signed)
CSN: [id number]     Arrival date & time 10/09/2013  1658 History   First MD Initiated Contact with Patient 10/12/2013 1703     Chief Complaint  Patient presents with  . Chest Pain   (Consider location/radiation/quality/duration/timing/severity/associated sxs/prior Treatment) HPI Comments: Patient presents with chest pain and shortness of breath onset around noon today. He describes a discomfort in his chest denies pain. It comes and goes he feels like he is breathing faster than usual. She was discharged 2 days ago after being admitted for back pain. He denies any change in this. He denies any fevers or vomiting. He denies any dizziness, lightheadedness, fever chills.  The history is provided by the patient.    Past Medical History  Diagnosis Date  . HTN (hypertension)   . HLD (hyperlipidemia)   . Ventricular tachycardia, non-sustained 2004  . Diastolic CHF, chronic     LVEF 65% 2008  . Atrial fibrillation   . Elevated troponin     In setting of atrial fibrillation with RVR: Lexiscan myoview in 3/11 showed EF 76%, no ischemia or infarction  . Seizures   . History of stroke   . Bone metastasis     Pelvis, hips and lumbar spine  . Prostate cancer 1993    Status post radioactive seeds and now on Lupron therapy  . Glaucoma   . CKD (chronic kidney disease) stage 3, GFR 30-59 ml/min   . History of GI bleed     On Pradaxa - Stopped July 2014  . Thrombocytopenia   . Shortness of breath 09/29/2013   Past Surgical History  Procedure Laterality Date  . Wrist surgery      left/screws  . Cataract extraction      bilat.eyes   Family History  Problem Relation Age of Onset  . Parkinsonism Father    History  Substance Use Topics  . Smoking status: Never Smoker   . Smokeless tobacco: Never Used  . Alcohol Use: Yes     Comment: Socially    Review of Systems  Constitutional: Negative for fever, activity change and appetite change.  Respiratory: Positive for shortness of breath.  Negative for cough and chest tightness.   Cardiovascular: Positive for chest pain and leg swelling.  Gastrointestinal: Negative for nausea, vomiting and abdominal pain.  Genitourinary: Negative for dysuria, hematuria and testicular pain.  Musculoskeletal: Negative for back pain.  Neurological: Negative for dizziness, syncope and headaches.  A complete 10 system review of systems was obtained and all systems are negative except as noted in the HPI and PMH.    Allergies  Review of patient's allergies indicates no known allergies.  Home Medications   Current Outpatient Rx  Name  Route  Sig  Dispense  Refill  . amiodarone (PACERONE) 200 MG tablet   Oral   Take 100 mg by mouth daily.         Marland Kitchen aspirin EC 81 MG tablet   Oral   Take 81 mg by mouth every other day.          Marland Kitchen atorvastatin (LIPITOR) 10 MG tablet   Oral   Take 10 mg by mouth daily.         . bicalutamide (CASODEX) 50 MG tablet   Oral   Take 50 mg by mouth daily.         Marland Kitchen diltiazem (CARDIZEM CD) 120 MG 24 hr capsule   Oral   Take 1 capsule (120 mg total) by mouth daily.  90 capsule   3   . docusate sodium (COLACE) 100 MG capsule   Oral   Take 100 mg by mouth every morning.          Marland Kitchen HYDROcodone-acetaminophen (NORCO/VICODIN) 5-325 MG per tablet   Oral   Take 1 tablet by mouth every 6 (six) hours as needed for moderate pain or severe pain.   30 tablet   0   . latanoprost (XALATAN) 0.005 % ophthalmic solution   Both Eyes   Place 1 drop into both eyes at bedtime.         . lidocaine (LIDODERM) 5 %   Transdermal   Place 1 patch onto the skin daily as needed. Remove & Discard patch within 12 hours or as directed by MD   30 patch   0   . Multiple Vitamin (MULTIVITAMIN) tablet   Oral   Take 1 tablet by mouth daily.           Marland Kitchen NITROSTAT 0.4 MG SL tablet      DISSOLVE 1 TABLET UNDER THETONGUE EVERY 5 MINUTES AS  NEEDED   100 tablet   3   . zonisamide (ZONEGRAN) 100 MG capsule   Oral    Take 2 capsules (200 mg total) by mouth daily.   180 capsule   3     Three month supply requested by the patient.    BP 124/74  Pulse 78  Temp(Src) 95.5 F (35.3 C) (Rectal)  Resp 33  SpO2 99% Physical Exam  Constitutional: He is oriented to person, place, and time. He appears well-developed and well-nourished. He appears distressed.  Mild respiratory distress, mild tachypnea  HENT:  Head: Normocephalic and atraumatic.  Mouth/Throat: Oropharynx is clear and moist. No oropharyngeal exudate.  Eyes: Conjunctivae and EOM are normal. Pupils are equal, round, and reactive to light.  Neck: Normal range of motion. Neck supple.  Cardiovascular: Normal rate, regular rhythm and normal heart sounds.   No murmur heard. Pulmonary/Chest: Breath sounds normal. He is in respiratory distress.  Shallow breath sounds, decreased at the bases  Abdominal: Soft. There is no tenderness. There is no rebound and no guarding.  Musculoskeletal: Normal range of motion. He exhibits edema. He exhibits no tenderness.  +2 pitting edema bilaterally.  Neurological: He is alert and oriented to person, place, and time. No cranial nerve deficit. He exhibits normal muscle tone. Coordination normal.  Skin: Skin is warm.    ED Course  Procedures (including critical care time) Labs Review Labs Reviewed  PRO B NATRIURETIC PEPTIDE - Abnormal; Notable for the following:    Pro B Natriuretic peptide (BNP) 2227.0 (*)    All other components within normal limits  CBC WITH DIFFERENTIAL - Abnormal; Notable for the following:    WBC 15.4 (*)    Neutrophils Relative % 87 (*)    Neutro Abs 13.5 (*)    Lymphocytes Relative 5 (*)    Monocytes Absolute 1.1 (*)    All other components within normal limits  COMPREHENSIVE METABOLIC PANEL - Abnormal; Notable for the following:    Sodium 132 (*)    Potassium 5.2 (*)    Glucose, Bld 170 (*)    BUN 37 (*)    Creatinine, Ser 1.54 (*)    GFR calc non Af Amer 38 (*)    GFR calc  Af Amer 44 (*)    All other components within normal limits  D-DIMER, QUANTITATIVE - Abnormal; Notable for the following:    D-Dimer,  Quant 2.41 (*)    All other components within normal limits  POCT I-STAT TROPONIN I - Abnormal; Notable for the following:    Troponin i, poc 0.16 (*)    All other components within normal limits  POCT I-STAT 3, BLOOD GAS (G3+) - Abnormal; Notable for the following:    pH, Arterial 7.343 (*)    pO2, Arterial 63.0 (*)    Acid-base deficit 5.0 (*)    All other components within normal limits  TROPONIN I  HEPARIN LEVEL (UNFRACTIONATED)  CBC  TROPONIN I  TROPONIN I  TROPONIN I   Imaging Review Dg Chest 2 View  October 31, 2013   CLINICAL DATA:  Chest pain.  EXAM: CHEST  2 VIEW  COMPARISON:  T-spine radiograph 10/03/2013.  FINDINGS: Low lung volumes. Stable cardiomegaly. Small left pleural effusion with underlying opacities. Trace right pleural effusion with right basilar atelectasis. Multiple gaseous distended loops of large and small bowel loops, incompletely evaluated. Stable T6 compression deformity with additional mild height loss of the T9, T10 and T11 vertebral bodies.  IMPRESSION: 1. Low lung volumes. Small left pleural effusion with underlying opacities which may represent atelectasis. Infection not excluded. 2. Multiple gaseous distended loops of small and large bowel. Ileus or obstruction not excluded. Recommend correlation with abdominal radiography.   Electronically Signed   By: Annia Belt M.D.   On: October 31, 2013 17:58    EKG Interpretation    Date/Time:  Tuesday 2013/10/31 19:02:24 EST Ventricular Rate:  85 PR Interval:    QRS Duration: 120 QT Interval:  459 QTC Calculation: 546 R Axis:   85 Text Interpretation:  Atrial flutter Incomplete left bundle branch block Probable anterolateral infarct, old Baseline wander in lead(s) V5 Artifact No significant change was found Confirmed by Manus Gunning  MD, Lameka Disla ([id number]) on 10/31/2013 7:08:19  PM            MDM   1. Atrial flutter   2. Acute on chronic diastolic heart failure   3. Atrial fibrillation   4. Malignant neoplasm of prostate metastatic to bone   5. Other and unspecified hyperlipidemia   6. Unspecified essential hypertension   7. CHF (congestive heart failure)   8. CKD (chronic kidney disease)   9. Shortness of breath    Patient with several hour history of chest tightness and shortness of breath. Recent admission for intractable back pain which has improved. States he feels uncomfortable in his chest until he is breathing faster than usual.  On exam he has decreased breath sounds with bibasilar crackles and peripheral edema. Has a history of diastolic heart failure and received IV fluids during his last hospitalization.  Concern for CHF exacerbation versus PE. Patient given IV Lasix in the ED. He remains tachypneic but not hypoxic. Point of care troponin was elevated but the lab one is negative. Denies any chest pain. Discussed with cardiology and Dr. Diona Browner who will consult. IV heparin started with possible ACS. This will also cover any possibility of PE. Patient not given IV contrast given his poor renal function. He'll be given IV heparin overnight and VQ scan will be checked in the morning. Discussed with Dr. Garald Balding, MD Oct 31, 2013 [phone]

## 2013-10-07 ENCOUNTER — Inpatient Hospital Stay (HOSPITAL_COMMUNITY): Payer: Medicare Other

## 2013-10-07 ENCOUNTER — Other Ambulatory Visit: Payer: Self-pay

## 2013-10-07 DIAGNOSIS — I4892 Unspecified atrial flutter: Secondary | ICD-10-CM

## 2013-10-07 DIAGNOSIS — R0602 Shortness of breath: Secondary | ICD-10-CM

## 2013-10-07 DIAGNOSIS — I5033 Acute on chronic diastolic (congestive) heart failure: Principal | ICD-10-CM

## 2013-10-07 DIAGNOSIS — N189 Chronic kidney disease, unspecified: Secondary | ICD-10-CM

## 2013-10-07 DIAGNOSIS — C419 Malignant neoplasm of bone and articular cartilage, unspecified: Secondary | ICD-10-CM

## 2013-10-07 DIAGNOSIS — I4891 Unspecified atrial fibrillation: Secondary | ICD-10-CM

## 2013-10-07 LAB — POCT I-STAT 3, ART BLOOD GAS (G3+)
Acid-base deficit: 9 mmol/L — ABNORMAL HIGH (ref 0.0–2.0)
Acid-base deficit: 11 mmol/L — ABNORMAL HIGH (ref 0.0–2.0)
Bicarbonate: 18 mEq/L — ABNORMAL LOW (ref 20.0–24.0)
Bicarbonate: 14.9 mEq/L — ABNORMAL LOW (ref 20.0–24.0)
O2 Saturation: 98 %
Patient temperature: 97.3
Patient temperature: 97.5
TCO2: 19 mmol/L (ref 0–100)
TCO2: 16 mmol/L (ref 0–100)
pCO2 arterial: 40.1 mmHg (ref 35.0–45.0)
pCO2 arterial: 32.1 mmHg — ABNORMAL LOW (ref 35.0–45.0)
pH, Arterial: 7.257 — ABNORMAL LOW (ref 7.350–7.450)
pO2, Arterial: 326 mmHg — ABNORMAL HIGH (ref 80.0–100.0)
pO2, Arterial: 113 mmHg — ABNORMAL HIGH (ref 80.0–100.0)

## 2013-10-07 LAB — POCT I-STAT, CHEM 8
BUN: 52 mg/dL — ABNORMAL HIGH (ref 6–23)
Calcium, Ion: 1.2 mmol/L (ref 1.13–1.30)
Chloride: 107 mEq/L (ref 96–112)
Creatinine, Ser: 2.5 mg/dL — ABNORMAL HIGH (ref 0.50–1.35)
Glucose, Bld: 201 mg/dL — ABNORMAL HIGH (ref 70–99)
HCT: 33 % — ABNORMAL LOW (ref 39.0–52.0)
Potassium: 5.1 mEq/L (ref 3.5–5.1)

## 2013-10-07 LAB — COMPREHENSIVE METABOLIC PANEL
AST: 52 U/L — ABNORMAL HIGH (ref 0–37)
Albumin: 3.1 g/dL — ABNORMAL LOW (ref 3.5–5.2)
Alkaline Phosphatase: 91 U/L (ref 39–117)
BUN: 54 mg/dL — ABNORMAL HIGH (ref 6–23)
CO2: 17 mEq/L — ABNORMAL LOW (ref 19–32)
Chloride: 97 mEq/L (ref 96–112)
Creatinine, Ser: 2.1 mg/dL — ABNORMAL HIGH (ref 0.50–1.35)
GFR calc non Af Amer: 26 mL/min — ABNORMAL LOW (ref >90)
Potassium: 5.5 mEq/L — ABNORMAL HIGH (ref 3.5–5.1)
Sodium: 135 mEq/L (ref 135–145)
Total Bilirubin: 0.5 mg/dL (ref 0.3–1.2)

## 2013-10-07 LAB — PROTIME-INR
INR: 1.4 (ref 0.00–1.49)
Prothrombin Time: 16.8 seconds — ABNORMAL HIGH (ref 11.6–15.2)
Prothrombin Time: 15.8 seconds — ABNORMAL HIGH (ref 11.6–15.2)

## 2013-10-07 LAB — CBC
HCT: 45.6 % (ref 39.0–52.0)
Hemoglobin: 15.7 g/dL (ref 13.0–17.0)
Hemoglobin: 12 g/dL — ABNORMAL LOW (ref 13.0–17.0)
MCH: 32.6 pg (ref 26.0–34.0)
MCH: 32.6 pg (ref 26.0–34.0)
MCH: 32 pg (ref 26.0–34.0)
MCHC: 34.4 g/dL (ref 30.0–36.0)
MCV: 95.2 fL (ref 78.0–100.0)
Platelets: 198 10*3/uL (ref 150–400)
Platelets: 159 10*3/uL (ref 150–400)
RBC: 4.81 MIL/uL (ref 4.22–5.81)
RBC: 4.6 MIL/uL (ref 4.22–5.81)
RBC: 3.75 MIL/uL — ABNORMAL LOW (ref 4.22–5.81)
RDW: 14.1 % (ref 11.5–15.5)
RDW: 14.4 % (ref 11.5–15.5)
WBC: 3.1 10*3/uL — ABNORMAL LOW (ref 4.0–10.5)

## 2013-10-07 LAB — BASIC METABOLIC PANEL
BUN: 58 mg/dL — ABNORMAL HIGH (ref 6–23)
CO2: 17 mEq/L — ABNORMAL LOW (ref 19–32)
CO2: 12 mEq/L — ABNORMAL LOW (ref 19–32)
Calcium: 8.6 mg/dL (ref 8.4–10.5)
Creatinine, Ser: 2.2 mg/dL — ABNORMAL HIGH (ref 0.50–1.35)
GFR calc non Af Amer: 25 mL/min — ABNORMAL LOW (ref >90)
Glucose, Bld: 154 mg/dL — ABNORMAL HIGH (ref 70–99)
Glucose, Bld: 161 mg/dL — ABNORMAL HIGH (ref 70–99)
Potassium: 5.7 mEq/L — ABNORMAL HIGH (ref 3.5–5.1)
Sodium: 134 mEq/L — ABNORMAL LOW (ref 135–145)
Sodium: 138 mEq/L (ref 135–145)

## 2013-10-07 LAB — PROCALCITONIN: Procalcitonin: 16.81 ng/mL

## 2013-10-07 LAB — TSH: TSH: 2.97 u[IU]/mL (ref 0.350–4.500)

## 2013-10-07 LAB — GLUCOSE, CAPILLARY
Glucose-Capillary: 82 mg/dL (ref 70–99)
Glucose-Capillary: 57 mg/dL — ABNORMAL LOW (ref 70–99)

## 2013-10-07 LAB — HEPARIN LEVEL (UNFRACTIONATED): Heparin Unfractionated: 0.77 IU/mL — ABNORMAL HIGH (ref 0.30–0.70)

## 2013-10-07 LAB — TROPONIN I: Troponin I: <0.3 ng/mL (ref <0.30)

## 2013-10-07 LAB — MRSA PCR SCREENING: MRSA by PCR: POSITIVE — AB

## 2013-10-07 MED ORDER — PIPERACILLIN-TAZOBACTAM IN DEX 2-0.25 GM/50ML IV SOLN
2.2500 g | Freq: Four times a day (QID) | INTRAVENOUS | Status: DC
  Filled 2013-10-07 (×3): qty 50

## 2013-10-07 MED ORDER — DILTIAZEM 12 MG/ML ORAL SUSPENSION
60.0000 mg | Freq: Three times a day (TID) | ORAL | Status: DC
  Administered 2013-10-07: 60 mg via ORAL
  Filled 2013-10-07 (×3): qty 6

## 2013-10-07 MED ORDER — SODIUM BICARBONATE 8.4 % IV SOLN
INTRAVENOUS | Status: DC
  Filled 2013-10-07 (×2): qty 150

## 2013-10-07 MED ORDER — FENTANYL CITRATE 0.05 MG/ML IJ SOLN
0.0000 ug/h | INTRAMUSCULAR | Status: DC
  Administered 2013-10-07: 25 ug/h via INTRAVENOUS
  Filled 2013-10-07: qty 50

## 2013-10-07 MED ORDER — FENTANYL CITRATE 0.05 MG/ML IJ SOLN
INTRAMUSCULAR | Status: AC
  Administered 2013-10-07: 200 ug
  Filled 2013-10-07: qty 4

## 2013-10-07 MED ORDER — PANTOPRAZOLE SODIUM 40 MG IV SOLR: 40.0000 mg | Freq: Two times a day (BID) | INTRAVENOUS | Status: DC

## 2013-10-07 MED ORDER — MIDAZOLAM HCL 2 MG/2ML IJ SOLN
INTRAMUSCULAR | Status: AC
  Administered 2013-10-07: 2 mg
  Filled 2013-10-07: qty 4

## 2013-10-07 MED ORDER — SODIUM BICARBONATE 8.4 % IV SOLN
50.0000 meq | Freq: Once | INTRAVENOUS | Status: AC
  Administered 2013-10-07: 50 meq via INTRAVENOUS

## 2013-10-07 MED ORDER — PANTOPRAZOLE SODIUM 40 MG IV SOLR
40.0000 mg | Freq: Two times a day (BID) | INTRAVENOUS | Status: DC
  Administered 2013-10-07: 40 mg via INTRAVENOUS
  Filled 2013-10-07 (×2): qty 40

## 2013-10-07 MED ORDER — ETOMIDATE 2 MG/ML IV SOLN
INTRAVENOUS | Status: AC
  Administered 2013-10-07: 20 mg
  Filled 2013-10-07: qty 10

## 2013-10-07 MED ORDER — SODIUM BICARBONATE 8.4 % IV SOLN
INTRAVENOUS | Status: DC
Start: 2013-10-07 — End: 2013-10-07
  Filled 2013-10-07: qty 100

## 2013-10-07 MED ORDER — CALCIUM CHLORIDE 10 % IV SOLN
1.0000 g | Freq: Once | INTRAVENOUS | Status: AC
  Administered 2013-10-07: 1 g via INTRAVENOUS
  Filled 2013-10-07: qty 10

## 2013-10-07 MED ORDER — SODIUM CHLORIDE 0.9 % IV BOLUS (SEPSIS)
1000.0000 mL | Freq: Once | INTRAVENOUS | Status: AC
  Administered 2013-10-07: 1000 mL via INTRAVENOUS

## 2013-10-07 MED ORDER — DEXTROSE 50 % IV SOLN
INTRAVENOUS | Status: AC
  Filled 2013-10-07: qty 50

## 2013-10-07 MED ORDER — FENTANYL CITRATE 0.05 MG/ML IJ SOLN: 50.0000 ug | Freq: Once | INTRAMUSCULAR | Status: AC

## 2013-10-07 MED ORDER — BIOTENE DRY MOUTH MT LIQD
15.0000 mL | Freq: Four times a day (QID) | OROMUCOSAL | Status: DC
  Administered 2013-10-07 (×2): 15 mL via OROMUCOSAL

## 2013-10-07 MED ORDER — PIPERACILLIN-TAZOBACTAM 3.375 G IVPB
3.3750 g | Freq: Three times a day (TID) | INTRAVENOUS | Status: DC
  Administered 2013-10-07: 3.375 g via INTRAVENOUS
  Filled 2013-10-07 (×2): qty 50

## 2013-10-07 MED ORDER — MORPHINE SULFATE 2 MG/ML IJ SOLN: 1.0000 mg | INTRAMUSCULAR | Status: DC | PRN

## 2013-10-07 MED ORDER — FENTANYL BOLUS VIA INFUSION
25.0000 ug | INTRAVENOUS | Status: DC | PRN
  Filled 2013-10-07: qty 50

## 2013-10-07 MED ORDER — CHLORHEXIDINE GLUCONATE 0.12 % MT SOLN
15.0000 mL | Freq: Two times a day (BID) | OROMUCOSAL | Status: DC
  Administered 2013-10-07: 15 mL via OROMUCOSAL
  Filled 2013-10-07: qty 15

## 2013-10-07 MED ORDER — DEXTROSE 50 % IV SOLN
1.0000 | Freq: Once | INTRAVENOUS | Status: AC
  Administered 2013-10-07: 50 mL via INTRAVENOUS

## 2013-10-07 MED ORDER — SODIUM CHLORIDE 0.9 % IV SOLN
8.0000 mg/h | INTRAVENOUS | Status: DC
  Administered 2013-10-07: 8 mg/h via INTRAVENOUS
  Filled 2013-10-07 (×3): qty 80

## 2013-10-07 MED ORDER — ATROPINE SULFATE 1 MG/ML IJ SOLN
0.1000 mg | Freq: Once | INTRAMUSCULAR | Status: AC
  Administered 2013-10-07: 0.1 mg via INTRAVENOUS

## 2013-10-07 MED ORDER — SODIUM CHLORIDE 0.9 % IV SOLN
80.0000 mg | Freq: Once | INTRAVENOUS | Status: AC
  Administered 2013-10-07: 12:00:00 80 mg via INTRAVENOUS
  Filled 2013-10-07: qty 80

## 2013-10-08 ENCOUNTER — Ambulatory Visit: Payer: Medicare Other | Admitting: Oncology

## 2013-10-09 LAB — CULTURE, RESPIRATORY

## 2013-10-09 LAB — CULTURE, RESPIRATORY W GRAM STAIN

## 2013-10-13 LAB — CULTURE, BLOOD (ROUTINE X 2): Culture: NO GROWTH

## 2013-10-16 NOTE — Discharge Summary (Signed)
Henry Meyers, Henry Meyers NO.:  [id number]  MEDICAL RECORD NO.:  [id number]  LOCATION:                               FACILITY:  [facility]  PHYSICIAN:  [name], MD DATE OF BIRTH:  1920-07-20  DATE OF ADMISSION:  10/14/2013 DATE OF DISCHARGE:  19-Oct-2013                              DISCHARGE SUMMARY   DEATH SUMMARY:  HISTORY:  This is a 79 year old male with history of hypertension, hyperlipidemia, stroke, chronic diastolic heart failure, CKD, paroxysmal atrial fibrillation as well as prostate cancer.  Pradaxa for his paroxysmal atrial fibrillation was discontinued at some stage due to GI bleeding in July.  The patient also has had history of chronic back pain related to metastatic prostate cancer, history of focal seizures, and debilitated deconditioned state.  The patient was admitted to the hospital and found to have coffee-grounds emesis, respiratory distress, and we were called for emergent evaluation for respiratory failure.  The patient had some vomitus of coffee-ground material and had developed likely aspiration of blood products and respiratory failure and was taken down to the intensive care unit.  To note, the patient on November 19, had shown a renal ultrasound with marked right hydronephrosis and right hydroureter with a renal parenchymal thinning on the right consistent with long-standing hydronephrosis.  The hydronephrosis appears to be new since the CT exam performed in 2012.  The patient required emergent intubation.  The patient was given empiric antibiotics including Zosyn for aspiration and concerns of the right hydronephrosis in the setting of kidney failure concerning for urosepsis association. Urology was consulted for difficult Foley placement, and evaluation of hydronephrosis and need for therapy.  The patient came back to the intensive care unit and was intubated easily and was noted to have worsening renal failure awaiting urology  placement of Foley catheter. The patient unfortunately worsened abruptly with hypertension and bradycardia concerning for sepsis, septic shock, and renal failure with hyperkalemia.  The patient was given aggressive volume resuscitation. Echocardiogram was ordered.  The patient was placed on dopamine for bradycardia associated with shock as well as central line placement, arterial line placement, and empiric treatment of hyperkalemia.  The patient just continued to rapidly decline, had discussion with the son on the telephone that he was doing very poorly and likely would not have a good functional recovery if he would survive.  His son did not wanted to have aggressive heroic measures in regards to resuscitation if heart were stopped and so DNR order was placed in the patient unfortunate and rapidly expired.  FINAL DIAGNOSES: 1. Acute renal failure, rule out obstruction with history of prostate     cancer metastatic. 2. Right hydronephrosis. 3. Rule out urosepsis. 4. Gastrointestinal bleed. 5. Aspiration event.     [name], MD   ______________________________ [name], MD    DJF/MEDQ  D:  10/14/2013  T:  10/15/2013  Job:  [id number]

## 2013-10-19 NOTE — Consult Note (Signed)
Urology Consult   Physician requesting consult: Rory Percy  Reason for consult: Difficult foley placement  History of Present Illness: Henry Meyers is a 79 y.o. male with PMH significant for metastatic CaP, HTN, CHF, afib, and CKD who was admitted on 10/03/2013 for eval of SOB.  During eval for PE he was placed on Heparin.  On the morning of 02/04/13 he had several episodes of coffee ground emesis.  He then developed SOB and cyanosis and a rapid response was called.  He was transferred to the ICU and intubated.  NGT was also placed.  Attempt was made by nursing to place a 42f foley which was unsuccessful.  Pt currently has a condom cath with no output, therefore, urology consult was requested.  Cr has been rising (1.54-->2.10)  Pt is resting comfortably and is intubated.  He is not completely sedated and shakes his head in response to questions but it is unclear if he fully understands.    Pt has metastatic prostate cancer which was initially treated with external beam radiation therapy and then subsequently with androgen deprivation and Casodex.  He is followed routinely by Dr. Retta Diones.    Past Medical History  Diagnosis Date  . HTN (hypertension)   . HLD (hyperlipidemia)   . Ventricular tachycardia, non-sustained 2004  . Diastolic CHF, chronic     LVEF 65% 2008  . Atrial fibrillation   . Elevated troponin     In setting of atrial fibrillation with RVR: Lexiscan myoview in 3/11 showed EF 76%, no ischemia or infarction  . Seizures   . History of stroke   . Bone metastasis     Pelvis, hips and lumbar spine  . Prostate cancer 1993    Status post radioactive seeds and now on Lupron therapy  . Glaucoma   . CKD (chronic kidney disease) stage 3, GFR 30-59 ml/min   . History of GI bleed     On Pradaxa - Stopped July 2014  . Thrombocytopenia   . Shortness of breath 10/16/2013  Osteoporsis  Past Surgical History  Procedure Laterality Date  . Wrist surgery      left/screws  .  Cataract extraction      bilat.eyes    Current Hospital Medications:  Home Meds:    Medication List    ASK your doctor about these medications       amiodarone 200 MG tablet  Commonly known as:  PACERONE  Take 100 mg by mouth daily.     aspirin EC 81 MG tablet  Take 81 mg by mouth every other day.     atorvastatin 10 MG tablet  Commonly known as:  LIPITOR  Take 10 mg by mouth daily.     bicalutamide 50 MG tablet  Commonly known as:  CASODEX  Take 50 mg by mouth daily.     diltiazem 120 MG 24 hr capsule  Commonly known as:  CARDIZEM CD  Take 1 capsule (120 mg total) by mouth daily.     docusate sodium 100 MG capsule  Commonly known as:  COLACE  Take 100 mg by mouth every morning.     HYDROcodone-acetaminophen 5-325 MG per tablet  Commonly known as:  NORCO/VICODIN  Take 1 tablet by mouth every 6 (six) hours as needed for moderate pain or severe pain.     latanoprost 0.005 % ophthalmic solution  Commonly known as:  XALATAN  Place 1 drop into both eyes at bedtime.     lidocaine 5 %  Commonly known as:  LIDODERM  Place 1 patch onto the skin daily as needed. Remove & Discard patch within 12 hours or as directed by MD     multivitamin tablet  Take 1 tablet by mouth daily.     NITROSTAT 0.4 MG SL tablet  Generic drug:  nitroGLYCERIN  DISSOLVE 1 TABLET UNDER THETONGUE EVERY 5 MINUTES AS  NEEDED     zonisamide 100 MG capsule  Commonly known as:  ZONEGRAN  Take 2 capsules (200 mg total) by mouth daily.        Scheduled Meds: . amiodarone  100 mg Oral Daily  . antiseptic oral rinse  15 mL Mouth Rinse QID  . atorvastatin  10 mg Oral q1800  . bicalutamide  50 mg Oral Daily  . chlorhexidine  15 mL Mouth Rinse BID  . diltiazem  60 mg Oral Q8H  . docusate sodium  100 mg Oral q morning - 10a  . latanoprost  1 drop Both Eyes QHS  . multivitamin with minerals  1 tablet Oral Daily  . [START ON 10/10/2013] pantoprazole (PROTONIX) IV  40 mg Intravenous Q12H  .  piperacillin-tazobactam (ZOSYN)  IV  3.375 g Intravenous Q8H  . sodium chloride  3 mL Intravenous Q12H  . zonisamide  200 mg Oral Daily   Continuous Infusions: . fentaNYL infusion INTRAVENOUS 25 mcg/hr (09/21/2013 1225)  . pantoprozole (PROTONIX) infusion 8 mg/hr (10/10/2013 1210)   PRN Meds:.fentaNYL, HYDROcodone-acetaminophen, HYDROmorphone (DILAUDID) injection, lidocaine, ondansetron (ZOFRAN) IV, ondansetron  Allergies: No Known Allergies  Family History  Problem Relation Age of Onset  . Parkinsonism Father     Social History:  reports that he has never smoked. He has never used smokeless tobacco. He reports that he drinks alcohol. He reports that he does not use illicit drugs.  ROS: Cannot obtain as pt is intubated  Physical Exam:  Vital signs in last 24 hours: Temp:  [93.8 F (34.3 C)-98.2 F (36.8 C)] 97.5 F (36.4 C) (11/19 1205) Pulse Rate:  [39-126] 54 (11/19 1200) Resp:  [14-36] 19 (11/19 1200) BP: (67-151)/(30-115) 140/89 mmHg (11/19 1200) SpO2:  [58 %-100 %] 98 % (11/19 1200) FiO2 (%):  [50 %-100 %] 50 % (11/19 1200) Weight:  [62.1 kg (136 lb 14.5 oz)-62.8 kg (138 lb 7.2 oz)] 62.8 kg (138 lb 7.2 oz) (11/19 0421) Constitutional:  No acute distress; awake, intubated Cardiovascular: irregular Respiratory: Intubated GI: Abdomen is soft, nontender, nondistended, no abdominal masses GU: condom cath in place; uncircm penis with no lesions, skin breakdown, or discharge Lymphatic: No lymphadenopathy Neurologic: Intubated; hand mitts in place; moves all ext Psychiatric: medicated and intubated  Laboratory Data:   Recent Labs  10/26/2013 1815 10/15/2013 0434 10/14/2013 0907  WBC 15.4* 10.7* 9.0  HGB 14.0 15.7 15.0  HCT 40.1 45.6 43.8  PLT 204 227 198     Recent Labs  26-Oct-2013 1815 09/30/2013 0255 09/26/2013 0907  NA 132* 134* 135  K 5.2* 5.7* 5.5*  CL 97 96 97  GLUCOSE 170* 154* 121*  BUN 37* 47* 54*  CALCIUM 9.3 9.2 8.5  CREATININE 1.54* 1.77* 2.10*      Results for orders placed during the hospital encounter of 10-26-2013 (from the past 24 hour(s))  PRO B NATRIURETIC PEPTIDE     Status: Abnormal   Collection Time    10-26-2013  6:15 PM      Result Value Range   Pro B Natriuretic peptide (BNP) 2227.0 (*) 0 - 450 pg/mL  CBC WITH DIFFERENTIAL  Status: Abnormal   Collection Time    09/29/2013  6:15 PM      Result Value Range   WBC 15.4 (*) 4.0 - 10.5 K/uL   RBC 4.24  4.22 - 5.81 MIL/uL   Hemoglobin 14.0  13.0 - 17.0 g/dL   HCT 40.9  81.1 - 91.4 %   MCV 94.6  78.0 - 100.0 fL   MCH 33.0  26.0 - 34.0 pg   MCHC 34.9  30.0 - 36.0 g/dL   RDW 78.2  95.6 - 21.3 %   Platelets 204  150 - 400 K/uL   Neutrophils Relative % 87 (*) 43 - 77 %   Neutro Abs 13.5 (*) 1.7 - 7.7 K/uL   Lymphocytes Relative 5 (*) 12 - 46 %   Lymphs Abs 0.8  0.7 - 4.0 K/uL   Monocytes Relative 7  3 - 12 %   Monocytes Absolute 1.1 (*) 0.1 - 1.0 K/uL   Eosinophils Relative 0  0 - 5 %   Eosinophils Absolute 0.0  0.0 - 0.7 K/uL   Basophils Relative 0  0 - 1 %   Basophils Absolute 0.0  0.0 - 0.1 K/uL  COMPREHENSIVE METABOLIC PANEL     Status: Abnormal   Collection Time    10/13/2013  6:15 PM      Result Value Range   Sodium 132 (*) 135 - 145 mEq/L   Potassium 5.2 (*) 3.5 - 5.1 mEq/L   Chloride 97  96 - 112 mEq/L   CO2 21  19 - 32 mEq/L   Glucose, Bld 170 (*) 70 - 99 mg/dL   BUN 37 (*) 6 - 23 mg/dL   Creatinine, Ser 0.86 (*) 0.50 - 1.35 mg/dL   Calcium 9.3  8.4 - 57.8 mg/dL   Total Protein 6.3  6.0 - 8.3 g/dL   Albumin 3.6  3.5 - 5.2 g/dL   AST 36  0 - 37 U/L   ALT 20  0 - 53 U/L   Alkaline Phosphatase 101  39 - 117 U/L   Total Bilirubin 0.5  0.3 - 1.2 mg/dL   GFR calc non Af Amer 38 (*) >90 mL/min   GFR calc Af Amer 44 (*) >90 mL/min  D-DIMER, QUANTITATIVE     Status: Abnormal   Collection Time    10/04/2013  6:15 PM      Result Value Range   D-Dimer, Quant 2.41 (*) 0.00 - 0.48 ug/mL-FEU  POCT I-STAT TROPONIN I     Status: Abnormal   Collection Time     10/12/2013  6:39 PM      Result Value Range   Troponin i, poc 0.16 (*) 0.00 - 0.08 ng/mL   Comment NOTIFIED PHYSICIAN     Comment 3           TROPONIN I     Status: None   Collection Time    10/15/2013  6:56 PM      Result Value Range   Troponin I <0.30  <0.30 ng/mL  TROPONIN I     Status: None   Collection Time    10/09/2013  8:17 PM      Result Value Range   Troponin I <0.30  <0.30 ng/mL  POCT I-STAT 3, BLOOD GAS (G3+)     Status: Abnormal   Collection Time    10/09/2013  8:28 PM      Result Value Range   pH, Arterial 7.343 (*) 7.350 - 7.450   pCO2  arterial 37.0  35.0 - 45.0 mmHg   pO2, Arterial 63.0 (*) 80.0 - 100.0 mmHg   Bicarbonate 20.1  20.0 - 24.0 mEq/L   TCO2 21  0 - 100 mmol/L   O2 Saturation 91.0     Acid-base deficit 5.0 (*) 0.0 - 2.0 mmol/L   Patient temperature 98.6 F     Collection site RADIAL, ALLEN'S TEST ACCEPTABLE     Drawn by Operator     Sample type ARTERIAL    TROPONIN I     Status: None   Collection Time    09/26/2013  2:55 AM      Result Value Range   Troponin I <0.30  <0.30 ng/mL  BASIC METABOLIC PANEL     Status: Abnormal   Collection Time    10/09/2013  2:55 AM      Result Value Range   Sodium 134 (*) 135 - 145 mEq/L   Potassium 5.7 (*) 3.5 - 5.1 mEq/L   Chloride 96  96 - 112 mEq/L   CO2 17 (*) 19 - 32 mEq/L   Glucose, Bld 154 (*) 70 - 99 mg/dL   BUN 47 (*) 6 - 23 mg/dL   Creatinine, Ser 2.13 (*) 0.50 - 1.35 mg/dL   Calcium 9.2  8.4 - 08.6 mg/dL   GFR calc non Af Amer 32 (*) >90 mL/min   GFR calc Af Amer 37 (*) >90 mL/min  HEPARIN LEVEL (UNFRACTIONATED)     Status: Abnormal   Collection Time    09/28/2013  4:34 AM      Result Value Range   Heparin Unfractionated 0.77 (*) 0.30 - 0.70 IU/mL  CBC     Status: Abnormal   Collection Time    09/21/2013  4:34 AM      Result Value Range   WBC 10.7 (*) 4.0 - 10.5 K/uL   RBC 4.81  4.22 - 5.81 MIL/uL   Hemoglobin 15.7  13.0 - 17.0 g/dL   HCT 57.8  46.9 - 62.9 %   MCV 94.8  78.0 - 100.0 fL   MCH 32.6  26.0  - 34.0 pg   MCHC 34.4  30.0 - 36.0 g/dL   RDW 52.8  41.3 - 24.4 %   Platelets 227  150 - 400 K/uL  TROPONIN I     Status: None   Collection Time    10/18/2013  8:15 AM      Result Value Range   Troponin I <0.30  <0.30 ng/mL  COMPREHENSIVE METABOLIC PANEL     Status: Abnormal   Collection Time    09/28/2013  9:07 AM      Result Value Range   Sodium 135  135 - 145 mEq/L   Potassium 5.5 (*) 3.5 - 5.1 mEq/L   Chloride 97  96 - 112 mEq/L   CO2 17 (*) 19 - 32 mEq/L   Glucose, Bld 121 (*) 70 - 99 mg/dL   BUN 54 (*) 6 - 23 mg/dL   Creatinine, Ser 0.10 (*) 0.50 - 1.35 mg/dL   Calcium 8.5  8.4 - 27.2 mg/dL   Total Protein 5.9 (*) 6.0 - 8.3 g/dL   Albumin 3.1 (*) 3.5 - 5.2 g/dL   AST 52 (*) 0 - 37 U/L   ALT 26  0 - 53 U/L   Alkaline Phosphatase 91  39 - 117 U/L   Total Bilirubin 0.5  0.3 - 1.2 mg/dL   GFR calc non Af Amer 26 (*) >90 mL/min  GFR calc Af Amer 30 (*) >90 mL/min  APTT     Status: Abnormal   Collection Time    10/06/2013  9:07 AM      Result Value Range   aPTT >200 (*) 24 - 37 seconds  PROTIME-INR     Status: Abnormal   Collection Time    10/06/2013  9:07 AM      Result Value Range   Prothrombin Time 16.8 (*) 11.6 - 15.2 seconds   INR 1.40  0.00 - 1.49  LACTIC ACID, PLASMA     Status: Abnormal   Collection Time    10/13/2013  9:07 AM      Result Value Range   Lactic Acid, Venous 8.1 (*) 0.5 - 2.2 mmol/L  CBC     Status: None   Collection Time    10/15/2013  9:07 AM      Result Value Range   WBC 9.0  4.0 - 10.5 K/uL   RBC 4.60  4.22 - 5.81 MIL/uL   Hemoglobin 15.0  13.0 - 17.0 g/dL   HCT 16.1  09.6 - 04.5 %   MCV 95.2  78.0 - 100.0 fL   MCH 32.6  26.0 - 34.0 pg   MCHC 34.2  30.0 - 36.0 g/dL   RDW 40.9  81.1 - 91.4 %   Platelets 198  150 - 400 K/uL  POCT I-STAT 3, BLOOD GAS (G3+)     Status: Abnormal   Collection Time    09/29/2013  9:12 AM      Result Value Range   pH, Arterial 7.257 (*) 7.350 - 7.450   pCO2 arterial 40.1  35.0 - 45.0 mmHg   pO2, Arterial 326.0 (*)  80.0 - 100.0 mmHg   Bicarbonate 18.0 (*) 20.0 - 24.0 mEq/L   TCO2 19  0 - 100 mmol/L   O2 Saturation 100.0     Acid-base deficit 9.0 (*) 0.0 - 2.0 mmol/L   Patient temperature 97.3 F     Collection site RADIAL, ALLEN'S TEST ACCEPTABLE     Drawn by Operator     Sample type ARTERIAL    MRSA PCR SCREENING     Status: Abnormal   Collection Time    10/05/2013  9:22 AM      Result Value Range   MRSA by PCR POSITIVE (*) NEGATIVE  PROTIME-INR     Status: Abnormal   Collection Time    10/14/2013 10:45 AM      Result Value Range   Prothrombin Time 15.8 (*) 11.6 - 15.2 seconds   INR 1.29  0.00 - 1.49  APTT     Status: Abnormal   Collection Time    10/02/2013 10:45 AM      Result Value Range   aPTT 142 (*) 24 - 37 seconds  PROCALCITONIN     Status: None   Collection Time    10/12/2013 11:03 AM      Result Value Range   Procalcitonin 16.81    GLUCOSE, CAPILLARY     Status: None   Collection Time    10/09/2013 12:03 PM      Result Value Range   Glucose-Capillary 82  70 - 99 mg/dL   Recent Results (from the past 240 hour(s))  MRSA PCR SCREENING     Status: Abnormal   Collection Time    10/16/2013  9:22 AM      Result Value Range Status   MRSA by PCR POSITIVE (*) NEGATIVE Final  Comment:            The GeneXpert MRSA Assay (FDA     approved for NASAL specimens     only), is one component of a     comprehensive MRSA colonization     surveillance program. It is not     intended to diagnose MRSA     infection nor to guide or     monitor treatment for     MRSA infections.     RESULT CALLED TO, READ BACK BY AND VERIFIED WITH:     T. HARVEY RN 11:50 10-Oct-2013 ([email or web address])    Renal Function:  Recent Labs  10/02/13 1213 10/02/13 1920 10/03/13 0625 09/25/2013 1815 10/10/13 0255 10/10/13 0907  CREATININE 1.50* 1.35 1.37* 1.54* 1.77* 2.10*   Estimated Creatinine Clearance: 19.9 ml/min (by C-G formula based on Cr of 2.1).  Radiologic Imaging: Dg Chest 2 View  10/16/2013   CLINICAL DATA:   Chest pain.  EXAM: CHEST  2 VIEW  COMPARISON:  T-spine radiograph 10/03/2013.  FINDINGS: Low lung volumes. Stable cardiomegaly. Small left pleural effusion with underlying opacities. Trace right pleural effusion with right basilar atelectasis. Multiple gaseous distended loops of large and small bowel loops, incompletely evaluated. Stable T6 compression deformity with additional mild height loss of the T9, T10 and T11 vertebral bodies.  IMPRESSION: 1. Low lung volumes. Small left pleural effusion with underlying opacities which may represent atelectasis. Infection not excluded. 2. Multiple gaseous distended loops of small and large bowel. Ileus or obstruction not excluded. Recommend correlation with abdominal radiography.   Electronically Signed   By: Annia Belt M.D.   On: 09/28/2013 17:58   Portable Chest Xray  October 10, 2013   CLINICAL DATA:  Endotracheal tube placement  EXAM: PORTABLE CHEST - 1 VIEW  COMPARISON:  09/24/2013  FINDINGS: The cardiac silhouette, mediastinal and hilar contours are stable. The endotracheal tube is 2.7 cm above the carinal. The NG tube is coursing down the esophagus and into the stomach. The tip is in the fundal region. External pacer pad also are noted. Persistent left effusion and overlying atelectasis.  IMPRESSION: Endotracheal tube in good position, 2.7 cm above the carinal.  Persistent left effusion and overlying atelectasis.   Electronically Signed   By: Loralie Champagne M.D.   On: 10/10/2013 09:30    Procedure: Foley placement. Area was prepped and draped in sterile fashion.  Attempt to place 16 coude cath unsuccessful.  Resistance felt at level of prostate.  Foley removed.  Pt tolerated well.    Impression/Recommendation  Difficult foley placement in pt with metastatic prostate cancer s/p EBRT---Dr. Retta Diones will perform bedside cysto with cath placement.  Obtain sterile urine specimen after cath placed.  If UA positive send for culture.    Henry Meyers 10/10/2013, 1:32 PM    I came by to attempt catheter placement--pt had recently expired.

## 2013-10-19 NOTE — Progress Notes (Signed)
Patient vomited 300 cc of brown emesis while sitting up at [facility]. Patient had no more complaints of nausea after vomiting. MD notified - no new orders will continue to monitor patient.

## 2013-10-19 NOTE — Progress Notes (Signed)
Temp is 93.8 orally, BP 125/52, HR 73, Respirations 32 on 3L, O2 Sats 58. Critical care MD, rapid response, attending MD, respiratory therapist all aware and is present at the time. Patient gradually becoming paler with labored breathing. Orders received. Preparing patient for transfer.

## 2013-10-19 NOTE — Progress Notes (Signed)
I have had extensive discussions with family son. We discussed patients current circumstances and organ failures. We also discussed patient's prior wishes under circumstances such as this. Family has decided to NOT perform resuscitation if arrest but to continue current medical support for now.  Daniel J. Feinstein, MD, FACP Pgr: [phone] [facility]  

## 2013-10-19 NOTE — Consult Note (Signed)
PULMONARY  / CRITICAL CARE MEDICINE  Name: Henry Meyers MRN: [id number] DOB: [birth year]    ADMISSION DATE:  Oct 12, 2013 CONSULTATION DATE:  09/29/2013 at 0902  REFERRING MD :  Dr. Houston Siren PRIMARY SERVICE: PCCM/IM  CHIEF COMPLAINT:  Coffee Ground Emesis, Respiratory Distress, Need of Airway Protection  BRIEF PATIENT DESCRIPTION:  Henry Meyers is a 79 year old male with HTN, HLD, CVA, Chronic Diastolic HF, CKD, PAF on Pradaxa and was discontinued due to GI bleed in July of this year, HOH, Chronic Back Pain due to metastatic prostate cancer, Focal Seizure, and Debility/Deconditioning being transferred to the ICU for further management of airway protection in light of coffee ground emesis and hypoxia, distress, likely aspiration blood.  SIGNIFICANT EVENTS / STUDIES:  11/19 renal US>>>Marked right hydronephrosis and hydroureter.  2. Renal parenchymal thinning on the right, consistent with  long-standing hydronephrosis. The hydronephrosis appears new since  the CT exam performed in 2012.  10/14/2013 Coffee ground emesis at 300cc around 3AM   Multiple emesis/bowel movements, sob, cyanotic   ICU transfer, NG tube, then Intubation  LINES / TUBES: Peripheral IV 10/15/2013 >>>> Foley 09/19/2013 >>> Will not go in Condom Catheter 10/09/2013 >>>> Endotracheal Tube 10/05/2013 >>> IJ Central line 09/19/2013 >>>  CULTURES: Sputum culture 10/18/2013 >>>> Blood culture 09/19/2013 >>>  ANTIBIOTICS: Zosyn 09/23/2013 >>>  HISTORY OF PRESENT ILLNESS:  Henry Meyers is a 79 year old male with HTN, HLD, CVA, Chronic Diastolic HF, CKD, PAF on Pradaxa and was discontinued due to GI bleed in July of this year, HOH, Chronic Back Pain due to metastatic prostate cancer, Focal Seizure, and Debility/Deconditioning being transferred to the ICU for further management of airway protection in light of coffee ground emesis.  The patient was admitted recently admitted on 10/02/13 for chronic intractable back pain and was discharge a few  days ago.  Yesterday, he came to the hospital for acute onset of shortness of breath and was treated with Heparin drip while PE was being work up.  In light of CKD IIIb, CTA was not done and V/Q scan is in progress.  This morning around 3AM, he was found to have about 300cc of coffee ground emesis by nursing staff.  A rapid response was called around 845AM for multiple coffee ground emesis, bowel movements, sob, and cyanotic.  Patient was given of NS at bedside and was placed on bag mask and transferred to the ICU.   While in the ICU during evaluation, NG tube was placed and coffee ground emesis was drained.  The patient was subsequently intubation in the ICU for airway protection and further management.  PAST MEDICAL HISTORY :  Past Medical History  Diagnosis Date  . HTN (hypertension)   . HLD (hyperlipidemia)   . Ventricular tachycardia, non-sustained 2004  . Diastolic CHF, chronic     LVEF 65% 2008  . Atrial fibrillation   . Elevated troponin     In setting of atrial fibrillation with RVR: Lexiscan myoview in 3/11 showed EF 76%, no ischemia or infarction  . Seizures   . History of stroke   . Bone metastasis     Pelvis, hips and lumbar spine  . Prostate cancer 1993    Status post radioactive seeds and now on Lupron therapy  . Glaucoma   . CKD (chronic kidney disease) stage 3, GFR 30-59 ml/min   . History of GI bleed     On Pradaxa - Stopped July 2014  . Thrombocytopenia   . Shortness  of breath 09/25/2013   Past Surgical History  Procedure Laterality Date  . Wrist surgery      left/screws  . Cataract extraction      bilat.eyes   Prior to Admission medications   Medication Sig Start Date End Date Taking? Authorizing Provider  amiodarone (PACERONE) 200 MG tablet Take 100 mg by mouth daily.   Yes Historical Provider, MD  aspirin EC 81 MG tablet Take 81 mg by mouth every other day.  05/05/13  Yes Laurey Morale, MD  atorvastatin (LIPITOR) 10 MG tablet Take 10 mg by mouth  daily.   Yes Historical Provider, MD  bicalutamide (CASODEX) 50 MG tablet Take 50 mg by mouth daily.   Yes Historical Provider, MD  diltiazem (CARDIZEM CD) 120 MG 24 hr capsule Take 1 capsule (120 mg total) by mouth daily. 08/24/13  Yes Laurey Morale, MD  docusate sodium (COLACE) 100 MG capsule Take 100 mg by mouth every morning.    Yes Historical Provider, MD  HYDROcodone-acetaminophen (NORCO/VICODIN) 5-325 MG per tablet Take 1 tablet by mouth every 6 (six) hours as needed for moderate pain or severe pain. 10/04/13  Yes Belkys A Regalado, MD  latanoprost (XALATAN) 0.005 % ophthalmic solution Place 1 drop into both eyes at bedtime.   Yes Historical Provider, MD  lidocaine (LIDODERM) 5 % Place 1 patch onto the skin daily as needed. Remove & Discard patch within 12 hours or as directed by MD 10/04/13  Yes Belkys A Regalado, MD  Multiple Vitamin (MULTIVITAMIN) tablet Take 1 tablet by mouth daily.     Yes Historical Provider, MD  NITROSTAT 0.4 MG SL tablet DISSOLVE 1 TABLET UNDER THETONGUE EVERY 5 MINUTES AS  NEEDED 07/03/13  Yes Laurey Morale, MD  zonisamide (ZONEGRAN) 100 MG capsule Take 2 capsules (200 mg total) by mouth daily. 07/28/12  Yes Milas Gain, MD   No Known Allergies  FAMILY HISTORY:  Family History  Problem Relation Age of Onset  . Parkinsonism Father    SOCIAL HISTORY:  reports that he has never smoked. He has never used smokeless tobacco. He reports that he drinks alcohol. He reports that he does not use illicit drugs.  REVIEW OF SYSTEMS:  Review of System is not possible     Patient state that he was on blood thiner  SUBJECTIVE:   VITAL SIGNS: Temp:  [93.8 F (34.3 C)-98.2 F (36.8 C)] 93.8 F (34.3 C) (11/19 0820) Pulse Rate:  [39-78] 73 (11/19 0820) Resp:  [24-36] 32 (11/19 0820) BP: (112-151)/(41-88) 125/52 mmHg (11/19 0820) SpO2:  [58 %-100 %] 58 % (11/19 0820) Weight:  [62.1 kg (136 lb 14.5 oz)-62.8 kg (138 lb 7.2 oz)] 62.8 kg (138 lb 7.2 oz) (11/19  0421) HEMODYNAMICS:   VENTILATOR SETTINGS:   INTAKE / OUTPUT: Intake/Output     11/18 0701 - 11/19 0700 11/19 0701 - 11/20 0700   Urine (mL/kg/hr) 75    Emesis/NG output 300    Total Output 375     Net -375          Urine Occurrence 1 x    Stool Occurrence 2 x      PHYSICAL EXAMINATION: General:  Elderly male in respiratory distress with mask on for oxygen support Neuro:  AAOx3 prior to intubation, no focal neurological deficit HEENT:   NCAT, Thin white hair, no lesion, no hematoma   EOMI, PERRLA, no redness, no discharge, no sign of infection   Bilateral intact, hearing aid in place, no  discharge noted, no sign of infection   Nose clear, NG tube in place   Dry mucosa with coffee ground material around orifice, poor dentition, no lesions noted on intubation Cardiovascular:  RRR, no murmur/rub/gallop, s1 and s2 heard, no s3 nor s4 aprreciated Lungs:  Coarse due to vent machine, no wheezing, no rhonchi  Breath sound is equal bilaterally after intubation, ET placement is properly in place Abdomen:  Epigastric slight distended and drastically smaller at the suprapubic region, soft, minimal bowel sound, no guarding Musculoskeletal:  Intact, no edema, (+) pulses and equal, multiple echymosis on bilateral UE, no swelling Skin:  Dry, warm to touch, echymosis on UE, no lesion,  LABS:  CBC  Recent Labs Lab 10/02/13 1920 2013/10/12 1815 10/04/2013 0434  WBC 7.5 15.4* 10.7*  HGB 14.1 14.0 15.7  HCT 39.8 40.1 45.6  PLT 168 204 227   Coag's No results found for this basename: APTT, INR,  in the last 168 hours BMET  Recent Labs Lab 10/03/13 0625 10-12-2013 1815 09/20/2013 0255  NA 133* 132* 134*  K 5.1 5.2* 5.7*  CL 100 97 96  CO2 22 21 17*  BUN 28* 37* 47*  CREATININE 1.37* 1.54* 1.77*  GLUCOSE 95 170* 154*   Electrolytes  Recent Labs Lab 10/03/13 0625 Oct 12, 2013 1815 09/20/2013 0255  CALCIUM 8.6 9.3 9.2   Sepsis Markers No results found for this basename: LATICACIDVEN,  PROCALCITON, O2SATVEN,  in the last 168 hours ABG  Recent Labs Lab 10/12/2013 2028  PHART 7.343*  PCO2ART 37.0  PO2ART 63.0*   Liver Enzymes  Recent Labs Lab 10-12-13 1815  AST 36  ALT 20  ALKPHOS 101  BILITOT 0.5  ALBUMIN 3.6   Cardiac Enzymes  Recent Labs Lab 10-12-2013 1815 12-Oct-2013 1856 Oct 12, 2013 2017 09/26/2013 0255  TROPONINI  --  <0.30 <0.30 <0.30  PROBNP 2227.0*  --   --   --    Glucose No results found for this basename: GLUCAP,  in the last 168 hours  Imaging Dg Chest 2 View  2013-10-12   CLINICAL DATA:  Chest pain.  EXAM: CHEST  2 VIEW  COMPARISON:  T-spine radiograph 10/03/2013.  FINDINGS: Low lung volumes. Stable cardiomegaly. Small left pleural effusion with underlying opacities. Trace right pleural effusion with right basilar atelectasis. Multiple gaseous distended loops of large and small bowel loops, incompletely evaluated. Stable T6 compression deformity with additional mild height loss of the T9, T10 and T11 vertebral bodies.  IMPRESSION: 1. Low lung volumes. Small left pleural effusion with underlying opacities which may represent atelectasis. Infection not excluded. 2. Multiple gaseous distended loops of small and large bowel. Ileus or obstruction not excluded. Recommend correlation with abdominal radiography.   Electronically Signed   By: Annia Belt M.D.   On: Oct 12, 2013 17:58     CXR: 09/24/2013 post intubation with proper ET placement, no edema, no consolidation observed  ASSESSMENT / PLAN:  PULMONARY A: Respiratory Failure, effusion as cause?  Likely secondary to aspiration of blood, underlying diastolic CHF.  Cannot rule out PE at this time, low clinical suspicion with effusion etx P:    Patient was on Heparin for PE work up, now stopped due to likely upper GIB  Vent and sedated for now, start 8 cc./kg, abg to follow  V/Q scan unable on vent, no ct as renal failure  Doppler for further PE/DVT work up and rv assessment - if suggest dvt / pe, would  consider filter  Zosyn for empiric coverage of aspiration PNA  Consider ct assessment left chest   CARDIOVASCULAR A: Chronic Diastolic HF  Last ECHO was in 8295 with EF 65% but diastolic dysfunction and regurgitation     Hypotension after intubation due to sedatives, likely     Documented Hypertension by history     PAF documented by History, was on Pradaxa that discontinued in July due to GIB, now on aminodarone/cardizem/ASA     Hyperlipidemia  P:   1L bolus for hypotension, will careful giving HF  Will hold antihypertensive treatment at this time  Will start a central line for continuous hypotension and bolus  May need pressors  Assess cbc, cvp  Will recheck ECHO for further evaluation rv  Continue Aminodarone, Cardizem per pharmacy conversion, will hold if continue for hypotension  Will hold Lasix at this until GIB stabilized  Hold ASA and all anticoagulation in light of UGIB  Continue statin, check lipid panel  Lactic acid  RENAL A:  CKD IIIB, rt hydro in setting prostate caHyperkalemia 5.7  Currently stable at baseline P:    Will give 1L bolus given hypotension  Will hold Lasix for now  EKG showed sinus rhythm without T wave abnormality  Will monitor for now and treat if elevate or when NG tube can be use for oral            bmet to follow, may need kayxlate  At age 20, crt 2.1, concern hd needs in future  Urology consult for foley and hydro new, prostate CA noted  If UA pos dirty and remains in shock, may need to consider perc nephrostomy  GASTROINTESTINAL A:  Upper GI bleed  P:    Intubation for airway protection and aspiration  Protonix IV drip, hold all antiplatelet and anticoagulation treatment  Consider GI consult  Cbc q12h  HEMATOLOGIC A:  No Current Abnormality  Will watch H&H given GIB and possible leukocytosis with likely aspiration P:   Continue monitoring  CBC Q6H until GI clear  scd  INFECTIOUS A:  Possible Aspiration PNA /blood P:    Zosyn  for empiric coverage  Sputum and blood culture sent  [facility] sent  ENDOCRINE A:  Metastatic Prostate Cancer    P:    Currently on hormone treatment with Casodex  Continue treatment when med can be given  Cortisol if drops BP  NEUROLOGIC A:  CVA by history  Was taken ASA prior to this GIB episode P:    Will hold ASA for now  Fen prn  Avoid benzo  TODAY'S SUMMARY:  resp failure, asp blood likely, GI bleed, dc hep, need urology for input and foley hydro  I have personally obtained a history, examined the patient, evaluated laboratory and imaging results, formulated the assessment and plan and placed orders. CRITICAL CARE: The patient is critically ill with multiple organ systems failure and requires high complexity decision making for assessment and support, frequent evaluation and titration of therapies, application of advanced monitoring technologies and extensive interpretation of multiple databases. Critical Care Time devoted to patient care services described in this note is 40 minutes.    Mcarthur Rossetti. Tyson Alias, MD, FACP Pgr: [phone] [facility]  Pulmonary and Critical Care Medicine [facility] Pager: [phone]  09/21/2013, 9:01 AM

## 2013-10-19 NOTE — Procedures (Signed)
Arterial Catheter Insertion Procedure Note Henry Meyers [id number] 12/23/1919  Procedure: Insertion of Arterial Catheter  Indications: Blood pressure monitoring and Frequent blood sampling  Procedure Details Consent: Unable to obtain consent because of emergent medical necessity. Time Out: Verified patient identification, verified procedure, site/side was marked, verified correct patient position, special equipment/implants available, medications/allergies/relevent history reviewed, required imaging and test results available.  Performed  Maximum sterile technique was used including antiseptics, cap, gloves, gown, hand hygiene, mask and sheet. Skin prep: Chlorhexidine; local anesthetic administered 20 gauge catheter was inserted into right femoral artery using the Seldinger technique.  Evaluation Blood flow good; BP tracing good. Complications: No apparent complications.   Henry Meyers 09/19/2013  Henry Meyers. Henry Alias, MD, FACP Pgr: [phone] [facility]

## 2013-10-19 NOTE — Procedures (Signed)
ACLS followed Impending bradycardia =T waves up k up?  Epi, atropine, dop, lines Treatment hyperk , calcium, bicarb etc  Improved  Mcarthur Rossetti. Tyson Alias, MD, FACP Pgr: [phone] [facility]

## 2013-10-19 NOTE — Progress Notes (Signed)
ANTICOAGULATION CONSULT NOTE Pharmacy Consult for heparin Indication: chest pain/ACS  No Known Allergies  Patient Measurements: Height: 5' (152.4 cm) Weight: 138 lb 7.2 oz (62.8 kg) IBW/kg (Calculated) : 50 Heparin Dosing Weight: 66 kg  Vital Signs: Temp: 97.8 F (36.6 C) (11/19 0421) Temp src: Oral (11/19 0421) BP: 151/88 mmHg (11/19 0421) Pulse Rate: 69 (11/19 0421)  Labs:  Recent Labs  2013/10/21 1815 10/21/2013 1856 Oct 21, 2013 2017 09/25/2013 0255 09/20/2013 0434  HGB 14.0  --   --   --  15.7  HCT 40.1  --   --   --  45.6  PLT 204  --   --   --  227  HEPARINUNFRC  --   --   --   --  0.77*  CREATININE 1.54*  --   --  1.77*  --   TROPONINI  --  <0.30 <0.30 <0.30  --     Estimated Creatinine Clearance: 21.2 ml/min (by C-G formula based on Cr of 1.77).  Assessment: 79 y.o. male with chest pain for heparin  Goal of Therapy:  Heparin level 0.3-0.7 units/ml Monitor platelets by anticoagulation protocol: Yes   Plan:  Decrease heparin 750 units/hr  Eddie Candle 10/05/2013,6:28 AM

## 2013-10-19 NOTE — Progress Notes (Signed)
Chaplain requested to be with patient who was actively dying. Family was out of state in transit. Chaplain prayed with patient and patient's nurse. Chaplain and pt's RN were present when patient passed. Pt's RN called pt's son and he was able to speak to pt just prior to pt's death.   Maurene Capes, [place] [phone]

## 2013-10-19 NOTE — Progress Notes (Signed)
Rapid response RN notified to come for a second look and MD is aware and is on the way to unit to follow up. Patient continues to vomit and is now having episodes of loose bowel movement as well. The appearance of stool is similar to emesis; dark brown, coffee grounds, foul smelling, and loose. No verbal complaints from patient. Patient continues to have labored breathing.

## 2013-10-19 NOTE — Progress Notes (Signed)
Patient successfully transferred to 2100 along with belongings and meds.

## 2013-10-19 NOTE — Progress Notes (Addendum)
PRN IV zofran was administered due to patient having non stop episodes of vomiting. Charge RN was notified to come and assist with patient. MD paged. Patient has signs of labored breathing. No verbal complaints.

## 2013-10-19 NOTE — Significant Event (Signed)
Rapid Response Event Note  Overview:called by RN for coffee ground emesis and bm's, and Respiratory distress Time Called: 0804 Arrival Time: 0806 Event Type: Respiratory  Initial Focused Assessment:  Called by primary RN for patient having vomited coffee ground emesis and BM's multiple times.  Patient became sob, and cyanotic.  Upon arrival to patients room, RN at bedside, Patient with increased WOB and SOB, cyanotic, patient on Nasal cannula @3lpm .  Patient alert and oriented. Unable to obtain pulse ox as Temp 93.  BP, 125/52, 73, RR 33.  Patient placed on NRB.    Interventions: MD at bedside, 250% NS bolus given. Heparin stopped.  EKG done on NRB mask.  Dr. Tyson Alias at bedside.  Patient placed on zoll and transferred to 2M13 via bed with 100%oxygen   Event Summary:   at      at          [facility]

## 2013-10-19 NOTE — Progress Notes (Signed)
*  Preliminary Results* Bilateral lower extremity venous duplex completed. Visualized veins of bilateral lower extremities are negative for deep vein thrombosis. There is no evidence of Baker's cyst bilaterally.   10/06/2013  Gertie Fey, RVT, RDCS, RDMS

## 2013-10-19 NOTE — Procedures (Signed)
Central Venous Catheter Insertion Procedure Note XAVIAR LUNN [id number]   [birth year]  Procedure: Insertion of Central Venous Catheter Indications: Assessment of intravascular volume and Drug and/or fluid administration  Procedure Details Consent: Unable to obtain consent because of emergent medical necessity. Time Out: Verified patient identification, verified procedure, site/side was marked, verified correct patient position, special equipment/implants available, medications/allergies/relevent history reviewed, required imaging and test results available.  Performed  Maximum sterile technique was used including cap, gloves, gown, hand hygiene, mask and sheet. Skin prep: Chlorhexidine; local anesthetic administered A antimicrobial bonded/coated triple lumen catheter was placed in the right femoral vein due to emergent situation using the Seldinger technique.  Evaluation Blood flow good Complications: No apparent complications Patient did tolerate procedure well. Chest X-ray ordered to verify placement.  CXR: normal.  Edelmiro Innocent J. 2013/11/05, 3:54 PM  May have been some contamination from gown, emergent tneed near arrest  Mcarthur Rossetti. Tyson Alias, MD, FACP Pgr: [phone] [facility]

## 2013-10-19 NOTE — Progress Notes (Addendum)
ANTIBIOTIC CONSULT NOTE - INITIAL  Pharmacy Consult:  Zosyn Indication:  Possible PNA  No Known Allergies  Patient Measurements: Height: 5' (152.4 cm) Weight: 138 lb 7.2 oz (62.8 kg) IBW/kg (Calculated) : 50  Vital Signs: Temp: 93.8 F (34.3 C) (11/19 0820) Temp src: Oral (11/19 0820) BP: 125/52 mmHg (11/19 0820) Pulse Rate: 73 (11/19 0820) Intake/Output from previous day: 11/18 0701 - 11/19 0700 In: -  Out: 375 [Urine:75; Emesis/NG output:300]  Labs:  Recent Labs  09/21/2013 1815 11-01-2013 0255 11/01/2013 0434  WBC 15.4*  --  10.7*  HGB 14.0  --  15.7  PLT 204  --  227  CREATININE 1.54* 1.77*  --    Estimated Creatinine Clearance: 21.2 ml/min (by C-G formula based on Cr of 1.77). No results found for this basename: VANCOTROUGH, VANCOPEAK, VANCORANDOM, GENTTROUGH, GENTPEAK, GENTRANDOM, TOBRATROUGH, TOBRAPEAK, TOBRARND, AMIKACINPEAK, AMIKACINTROU, AMIKACIN,  in the last 72 hours   Microbiology: No results found for this or any previous visit (from the past 720 hour(s)).  Medical History: Past Medical History  Diagnosis Date  . HTN (hypertension)   . HLD (hyperlipidemia)   . Ventricular tachycardia, non-sustained 2004  . Diastolic CHF, chronic     LVEF 65% 2008  . Atrial fibrillation   . Elevated troponin     In setting of atrial fibrillation with RVR: Lexiscan myoview in 3/11 showed EF 76%, no ischemia or infarction  . Seizures   . History of stroke   . Bone metastasis     Pelvis, hips and lumbar spine  . Prostate cancer 1993    Status post radioactive seeds and now on Lupron therapy  . Glaucoma   . CKD (chronic kidney disease) stage 3, GFR 30-59 ml/min   . History of GI bleed     On Pradaxa - Stopped July 2014  . Thrombocytopenia   . Shortness of breath 10/05/2013       Assessment: 11 YOM admitted with complaint of SOB, then transferred to the ICU due to respiratory distress.  Zosyn to start for possible aspiration PNA. Noted patient has a history of  CKD.   Goal of Therapy:  Infection prevention / clearance of infection   Plan:  - Zosyn 3.375gm IV Q8H, 4 hr infusion - Monitor renal fxn, clinical course, K+ - F/U VQ scan, resolution of bleeding    Emerson Barretto D. Laney Potash, PharmD, BCPS Pager:  [phone] 2013-11-01, 9:09 AM   ======================================  Addendum:   - New labs today showed SCr worsened, 1.77 >> 2.10 - CrCL ~20 ml/min   Plan: - Change Zosyn to 2.25gm IV Q6H - Continue to monitor renal fxn, clinical course    Shronda Boeh D. Laney Potash, PharmD, BCPS Pager:  [phone] 01-Nov-2013, 1:57 PM

## 2013-10-19 NOTE — Progress Notes (Signed)
225 ml of fentanyl wasted in pt sink. Witnessed by [employer]

## 2013-10-19 NOTE — Progress Notes (Signed)
Report given to receiving RN. Patient belongings and medications packed.

## 2013-10-19 NOTE — Progress Notes (Addendum)
[facility] PROGRESS NOTE      Henry Meyers BJY:[id number] DOB: 1920-08-01 DOA: 10/14/2013 PCP: Henry Jenny, MD  HPI: Henry Meyers is an 79 y.o. male with a long history of metastatic prostate cancer and chronic low back pain, history of PAF, not on anticoagulation because of GI bleed previously while on Pradaxa, Hx of VTach, diastolic CHF, prior CVA, hx of seizure, recently admitted and discharged a few days ago for intractable back pain, returned today for acute onset of shortness of breath without chest pain. He denied fever, chills, or coughs. Evaluation in the ER included a negative troponin (though Istat troponin was 0.16), BNP of 2227, Cr of 1.5, K of 5.2, WBC of 15K, normal Hb. His EKG showed afib with controlled rate, and CXR showed small bilateral effusion, can't excluded infiltrates, and dilated bowel loops. Cardiology was consulted, recommended continuation of his cardiac meds and to obtain cardiac ECHO. He was started on IV Heparin pending V/Q scan. Hospitalist was asked to admit him for further evaluation and Tx.   Assessment/Plan: Took over patient this morning. Is informed the nursing staff that patient has been vomiting more coffee-ground/feculent material this morning, and was feeling more short of breath.  Patient came in with some reported chest pain, shortness of breath, elevated BNP, elevated d-dimer, concern for PE/ACS and was started on heparin drip. He does have a history of GI bleed while he was anticoagulated 2 years ago for his atrial fibrillation. On my evaluation, initial blood pressure was in the 90 systolic, heart rate of 30s, oxygen saturation mid 60s on a nonrebreather. Stat blood work was obtained, he was started on IV fluids, with improvement in his heart rate into the 70s by the time EKG was obtained, his blood pressure has improved to 120 systolic, however he was hypothermic and still persistently hypoxic. He is alert and oriented x4. PCCM was consulted  and patient was transferred to the ICU. This is probably related to an underlying GI bleed in the setting of probable ileus per imaging last night, patient might have very well had an aspiration event given severe emesis. His son Henry Meyers was informed about patient's clinical course this morning.  Code Status: Full code Family Communication: Henry Meyers [phone], [phone])  Disposition Plan: Transfer to ICU  Consultants:  PCCM  Procedures:  None   Antibiotics  Anti-infectives   Start     Dose/Rate Route Frequency Ordered Stop   09/29/2013 1800  piperacillin-tazobactam (ZOSYN) IVPB 2.25 g     2.25 g 100 mL/hr over 30 Minutes Intravenous Every 6 hours 10/02/2013 1358     09/24/2013 1000  piperacillin-tazobactam (ZOSYN) IVPB 3.375 g  Status:  Discontinued     3.375 g 12.5 mL/hr over 240 Minutes Intravenous Every 8 hours 10/12/2013 0910 09/28/2013 1357     Antibiotics Given (last 72 hours)   Date/Time Action Medication Dose Rate   09/28/2013 1048 Given   piperacillin-tazobactam (ZOSYN) IVPB 3.375 g 3.375 g 12.5 mL/hr      HPI/Subjective: - Endorses shortness or breath, nausea or vomiting.  Objective: Filed Vitals:   09/23/2013 1100 09/25/2013 1113 10/15/2013 1200 10/01/2013 1205  BP: 81/66  140/89   Pulse:  63 54   Temp:    97.5 F (36.4 C)  TempSrc:    Oral  Resp: 16 20 19    Height:   5\' 5"  (1.651 m)   Weight:      SpO2: 98% 96% 98%     Intake/Output Summary (Last 24  hours) at 09/22/2013 1438 Last data filed at 09/30/2013 1210  Gross per 24 hour  Intake    150 ml  Output    375 ml  Net   -225 ml   Filed Weights   09/21/2013 2136 10/03/2013 0421  Weight: 62.1 kg (136 lb 14.5 oz) 62.8 kg (138 lb 7.2 oz)    Exam:   General:  Appears distressed, cyanotic  Cardiovascular: regular rate and rhythm, without MRG  Respiratory: Coarse breath sounds on clear auscultation  Abdomen: soft, not tender to palpation  MSK: no peripheral edema  Neuro: Grossly nonfocal  Data Reviewed: Basic  Metabolic Panel:  Recent Labs Lab 10/02/13 1920 10/03/13 0625 10/03/2013 1815 10/05/2013 0255 10/12/2013 0907  NA 129* 133* 132* 134* 135  K 4.9 5.1 5.2* 5.7* 5.5*  CL 96 100 97 96 97  CO2 23 22 21  17* 17*  GLUCOSE 146* 95 170* 154* 121*  BUN 27* 28* 37* 47* 54*  CREATININE 1.35 1.37* 1.54* 1.77* 2.10*  CALCIUM 8.7 8.6 9.3 9.2 8.5   Liver Function Tests:  Recent Labs Lab 09/25/2013 1815 09/29/2013 0907  AST 36 52*  ALT 20 26  ALKPHOS 101 91  BILITOT 0.5 0.5  PROT 6.3 5.9*  ALBUMIN 3.6 3.1*   No results found for this basename: LIPASE, AMYLASE,  in the last 168 hours No results found for this basename: AMMONIA,  in the last 168 hours CBC:  Recent Labs Lab 10/02/13 1139 10/02/13 1213 10/02/13 1920 10/05/2013 1815 09/24/2013 0434 10/06/2013 0907  WBC 9.0  --  7.5 15.4* 10.7* 9.0  NEUTROABS 7.5  --   --  13.5*  --   --   HGB 13.5 13.9 14.1 14.0 15.7 15.0  HCT 39.2 41.0 39.8 40.1 45.6 43.8  MCV 91.4  --  91.5 94.6 94.8 95.2  PLT 180  --  168 204 227 198   Cardiac Enzymes:  Recent Labs Lab 10/15/2013 1856 10/16/2013 2017 09/21/2013 0255 10/03/2013 0815  TROPONINI <0.30 <0.30 <0.30 <0.30   BNP (last 3 results)  Recent Labs  05/05/13 1510 10/10/2013 1815  PROBNP 270.0* 2227.0*   CBG:  Recent Labs Lab 09/23/2013 1203  GLUCAP 82    Recent Results (from the past 240 hour(s))  MRSA PCR SCREENING     Status: Abnormal   Collection Time    10/09/2013  9:22 AM      Result Value Range Status   MRSA by PCR POSITIVE (*) NEGATIVE Final   Comment:            The GeneXpert MRSA Assay (FDA     approved for NASAL specimens     only), is one component of a     comprehensive MRSA colonization     surveillance program. It is not     intended to diagnose MRSA     infection nor to guide or     monitor treatment for     MRSA infections.     RESULT CALLED TO, READ BACK BY AND VERIFIED WITH:     T. HARVEY RN 11:50 09/28/2013 ([email or web address])     Studies: Dg Chest 2 View  10/15/2013    CLINICAL DATA:  Chest pain.  EXAM: CHEST  2 VIEW  COMPARISON:  T-spine radiograph 10/03/2013.  FINDINGS: Low lung volumes. Stable cardiomegaly. Small left pleural effusion with underlying opacities. Trace right pleural effusion with right basilar atelectasis. Multiple gaseous distended loops of large and small bowel loops, incompletely evaluated. Stable T6 compression deformity  with additional mild height loss of the T9, T10 and T11 vertebral bodies.  IMPRESSION: 1. Low lung volumes. Small left pleural effusion with underlying opacities which may represent atelectasis. Infection not excluded. 2. Multiple gaseous distended loops of small and large bowel. Ileus or obstruction not excluded. Recommend correlation with abdominal radiography.   Electronically Signed   By: Annia Belt M.D.   On: 09/20/2013 17:58   Portable Chest Xray  10/17/2013   CLINICAL DATA:  Endotracheal tube placement  EXAM: PORTABLE CHEST - 1 VIEW  COMPARISON:  09/20/2013  FINDINGS: The cardiac silhouette, mediastinal and hilar contours are stable. The endotracheal tube is 2.7 cm above the carinal. The NG tube is coursing down the esophagus and into the stomach. The tip is in the fundal region. External pacer pad also are noted. Persistent left effusion and overlying atelectasis.  IMPRESSION: Endotracheal tube in good position, 2.7 cm above the carinal.  Persistent left effusion and overlying atelectasis.   Electronically Signed   By: Loralie Champagne M.D.   On: 10/01/2013 09:30    Scheduled Meds: . amiodarone  100 mg Oral Daily  . antiseptic oral rinse  15 mL Mouth Rinse QID  . atorvastatin  10 mg Oral q1800  . bicalutamide  50 mg Oral Daily  . chlorhexidine  15 mL Mouth Rinse BID  . diltiazem  60 mg Oral Q8H  . docusate sodium  100 mg Oral q morning - 10a  . latanoprost  1 drop Both Eyes QHS  . multivitamin with minerals  1 tablet Oral Daily  . [START ON 10/10/2013] pantoprazole (PROTONIX) IV  40 mg Intravenous Q12H  .  piperacillin-tazobactam (ZOSYN)  IV  2.25 g Intravenous Q6H  . sodium chloride  3 mL Intravenous Q12H  . zonisamide  200 mg Oral Daily   Continuous Infusions: . fentaNYL infusion INTRAVENOUS 25 mcg/hr (10/02/2013 1225)  . pantoprozole (PROTONIX) infusion 8 mg/hr (10/06/2013 1210)    Principal Problem:   Shortness of breath Active Problems:   HYPERTENSION   Atrial fibrillation   DIASTOLIC HEART FAILURE, CHRONIC   Hearing loss of both ears   Focal seizures   Easy bruising   Intractable low back pain   Physical deconditioning   CKD (chronic kidney disease)   Atrial flutter   Acute on chronic diastolic heart failure   Time spent: 35  Pamella Pert, MD [facility] Pager [phone]. If 7 PM - 7 AM, please contact night-coverage at [email or web address], password [email or web address] 10/13/2013, 2:38 PM  LOS: 1 day

## 2013-10-19 NOTE — Procedures (Signed)
Intubation Procedure Note Henry Meyers [id number] 1920/03/26  Procedure: Intubation Indications: Airway protection and maintenance  Procedure Details Consent: Unable to obtain consent because of emergent medical necessity. Time Out: Verified patient identification, verified procedure, site/side was marked, verified correct patient position, special equipment/implants available, medications/allergies/relevent history reviewed, required imaging and test results available.  Performed  Maximum sterile technique was used including gloves, gown and hand hygiene.  MAC and 3    Evaluation Hemodynamic Status: BP stable throughout; O2 sats: stable throughout Patient's Current Condition: stable Complications: No apparent complications Patient did tolerate procedure well. Chest X-ray ordered to verify placement.  CXR: pending.   [name] 09/27/2013

## 2013-10-19 DEATH — deceased

## 2013-10-26 ENCOUNTER — Other Ambulatory Visit: Payer: Self-pay | Admitting: Cardiology
